# Patient Record
Sex: Male | Born: 1950 | Race: White | Hispanic: No | Marital: Single | Smoking: Current some day smoker
Health system: Southern US, Community
[De-identification: ages and names within clinical notes are randomized; demographics above are authoritative.]

## PROBLEM LIST (undated history)

## (undated) DIAGNOSIS — I10 Essential (primary) hypertension: Secondary | ICD-10-CM

## (undated) DIAGNOSIS — H44002 Unspecified purulent endophthalmitis, left eye: Secondary | ICD-10-CM

## (undated) DIAGNOSIS — K219 Gastro-esophageal reflux disease without esophagitis: Secondary | ICD-10-CM

## (undated) DIAGNOSIS — R768 Other specified abnormal immunological findings in serum: Secondary | ICD-10-CM

## (undated) DIAGNOSIS — F909 Attention-deficit hyperactivity disorder, unspecified type: Secondary | ICD-10-CM

## (undated) DIAGNOSIS — L80 Vitiligo: Secondary | ICD-10-CM

## (undated) DIAGNOSIS — R739 Hyperglycemia, unspecified: Secondary | ICD-10-CM

## (undated) DIAGNOSIS — M545 Low back pain, unspecified: Secondary | ICD-10-CM

## (undated) DIAGNOSIS — E1110 Type 2 diabetes mellitus with ketoacidosis without coma: Secondary | ICD-10-CM

## (undated) DIAGNOSIS — E119 Type 2 diabetes mellitus without complications: Secondary | ICD-10-CM

## (undated) DIAGNOSIS — G8929 Other chronic pain: Secondary | ICD-10-CM

## (undated) HISTORY — PX: INGUINAL HERNIA REPAIR: SUR1180

## (undated) HISTORY — PX: CHOLECYSTECTOMY: SHX55

---

## 1978-05-09 HISTORY — PX: KNEE CARTILAGE SURGERY: SHX688

## 2008-12-04 ENCOUNTER — Encounter: Admission: RE | Admit: 2008-12-04 | Discharge: 2008-12-04 | Payer: Self-pay | Admitting: Neurology

## 2009-03-09 HISTORY — PX: BRAIN SURGERY: SHX531

## 2012-10-30 ENCOUNTER — Telehealth: Payer: Self-pay | Admitting: Neurology

## 2012-10-31 NOTE — Telephone Encounter (Signed)
Patient will be assigned a doctor and called to be schedule.

## 2015-07-16 ENCOUNTER — Other Ambulatory Visit: Payer: Self-pay | Admitting: Family Medicine

## 2015-07-16 DIAGNOSIS — Z136 Encounter for screening for cardiovascular disorders: Secondary | ICD-10-CM

## 2015-07-27 ENCOUNTER — Ambulatory Visit: Payer: Self-pay

## 2015-07-28 ENCOUNTER — Ambulatory Visit
Admission: RE | Admit: 2015-07-28 | Discharge: 2015-07-28 | Disposition: A | Discharge status: Home / Self Care | Payer: Medicare Other | Source: Ambulatory Visit | Attending: Family Medicine | Admitting: Family Medicine

## 2015-07-28 DIAGNOSIS — Z136 Encounter for screening for cardiovascular disorders: Secondary | ICD-10-CM

## 2015-10-13 DIAGNOSIS — H2512 Age-related nuclear cataract, left eye: Secondary | ICD-10-CM | POA: Diagnosis not present

## 2015-10-13 DIAGNOSIS — H2511 Age-related nuclear cataract, right eye: Secondary | ICD-10-CM | POA: Diagnosis not present

## 2015-10-13 DIAGNOSIS — H02839 Dermatochalasis of unspecified eye, unspecified eyelid: Secondary | ICD-10-CM | POA: Diagnosis not present

## 2015-10-13 DIAGNOSIS — H18411 Arcus senilis, right eye: Secondary | ICD-10-CM | POA: Diagnosis not present

## 2015-10-26 DIAGNOSIS — H2512 Age-related nuclear cataract, left eye: Secondary | ICD-10-CM | POA: Diagnosis not present

## 2015-12-09 DIAGNOSIS — L720 Epidermal cyst: Secondary | ICD-10-CM | POA: Diagnosis not present

## 2015-12-09 DIAGNOSIS — L80 Vitiligo: Secondary | ICD-10-CM | POA: Diagnosis not present

## 2016-01-13 DIAGNOSIS — L578 Other skin changes due to chronic exposure to nonionizing radiation: Secondary | ICD-10-CM | POA: Diagnosis not present

## 2016-01-13 DIAGNOSIS — L0291 Cutaneous abscess, unspecified: Secondary | ICD-10-CM | POA: Diagnosis not present

## 2016-01-14 DIAGNOSIS — E119 Type 2 diabetes mellitus without complications: Secondary | ICD-10-CM | POA: Diagnosis not present

## 2016-01-14 DIAGNOSIS — F900 Attention-deficit hyperactivity disorder, predominantly inattentive type: Secondary | ICD-10-CM | POA: Diagnosis not present

## 2016-01-14 DIAGNOSIS — Z23 Encounter for immunization: Secondary | ICD-10-CM | POA: Diagnosis not present

## 2016-01-14 DIAGNOSIS — Z7984 Long term (current) use of oral hypoglycemic drugs: Secondary | ICD-10-CM | POA: Diagnosis not present

## 2016-01-14 DIAGNOSIS — I10 Essential (primary) hypertension: Secondary | ICD-10-CM | POA: Diagnosis not present

## 2016-08-03 DIAGNOSIS — Z79899 Other long term (current) drug therapy: Secondary | ICD-10-CM | POA: Diagnosis not present

## 2016-08-03 DIAGNOSIS — I10 Essential (primary) hypertension: Secondary | ICD-10-CM | POA: Diagnosis not present

## 2016-08-03 DIAGNOSIS — Z7984 Long term (current) use of oral hypoglycemic drugs: Secondary | ICD-10-CM | POA: Diagnosis not present

## 2016-08-03 DIAGNOSIS — Z0001 Encounter for general adult medical examination with abnormal findings: Secondary | ICD-10-CM | POA: Diagnosis not present

## 2016-08-03 DIAGNOSIS — M542 Cervicalgia: Secondary | ICD-10-CM | POA: Diagnosis not present

## 2016-08-03 DIAGNOSIS — F900 Attention-deficit hyperactivity disorder, predominantly inattentive type: Secondary | ICD-10-CM | POA: Diagnosis not present

## 2016-08-03 DIAGNOSIS — E7800 Pure hypercholesterolemia, unspecified: Secondary | ICD-10-CM | POA: Diagnosis not present

## 2016-08-03 DIAGNOSIS — Z125 Encounter for screening for malignant neoplasm of prostate: Secondary | ICD-10-CM | POA: Diagnosis not present

## 2016-08-03 DIAGNOSIS — E119 Type 2 diabetes mellitus without complications: Secondary | ICD-10-CM | POA: Diagnosis not present

## 2016-08-03 DIAGNOSIS — R7309 Other abnormal glucose: Secondary | ICD-10-CM | POA: Diagnosis not present

## 2016-08-04 DIAGNOSIS — M47812 Spondylosis without myelopathy or radiculopathy, cervical region: Secondary | ICD-10-CM | POA: Diagnosis not present

## 2016-08-04 DIAGNOSIS — M542 Cervicalgia: Secondary | ICD-10-CM | POA: Diagnosis not present

## 2016-08-04 DIAGNOSIS — M461 Sacroiliitis, not elsewhere classified: Secondary | ICD-10-CM | POA: Diagnosis not present

## 2016-08-04 DIAGNOSIS — M50221 Other cervical disc displacement at C4-C5 level: Secondary | ICD-10-CM | POA: Diagnosis not present

## 2016-08-04 DIAGNOSIS — M791 Myalgia: Secondary | ICD-10-CM | POA: Diagnosis not present

## 2016-08-09 DIAGNOSIS — M50221 Other cervical disc displacement at C4-C5 level: Secondary | ICD-10-CM | POA: Diagnosis not present

## 2016-08-09 DIAGNOSIS — M9901 Segmental and somatic dysfunction of cervical region: Secondary | ICD-10-CM | POA: Diagnosis not present

## 2016-08-09 DIAGNOSIS — M791 Myalgia: Secondary | ICD-10-CM | POA: Diagnosis not present

## 2016-08-09 DIAGNOSIS — M9903 Segmental and somatic dysfunction of lumbar region: Secondary | ICD-10-CM | POA: Diagnosis not present

## 2016-08-09 DIAGNOSIS — M47812 Spondylosis without myelopathy or radiculopathy, cervical region: Secondary | ICD-10-CM | POA: Diagnosis not present

## 2016-08-09 DIAGNOSIS — M50322 Other cervical disc degeneration at C5-C6 level: Secondary | ICD-10-CM | POA: Diagnosis not present

## 2016-08-09 DIAGNOSIS — M6240 Contracture of muscle, unspecified site: Secondary | ICD-10-CM | POA: Diagnosis not present

## 2016-08-09 DIAGNOSIS — M461 Sacroiliitis, not elsewhere classified: Secondary | ICD-10-CM | POA: Diagnosis not present

## 2016-08-09 DIAGNOSIS — M542 Cervicalgia: Secondary | ICD-10-CM | POA: Diagnosis not present

## 2016-08-11 DIAGNOSIS — M9903 Segmental and somatic dysfunction of lumbar region: Secondary | ICD-10-CM | POA: Diagnosis not present

## 2016-08-11 DIAGNOSIS — M461 Sacroiliitis, not elsewhere classified: Secondary | ICD-10-CM | POA: Diagnosis not present

## 2016-08-11 DIAGNOSIS — M6240 Contracture of muscle, unspecified site: Secondary | ICD-10-CM | POA: Diagnosis not present

## 2016-08-11 DIAGNOSIS — M791 Myalgia: Secondary | ICD-10-CM | POA: Diagnosis not present

## 2016-08-11 DIAGNOSIS — M9901 Segmental and somatic dysfunction of cervical region: Secondary | ICD-10-CM | POA: Diagnosis not present

## 2016-08-11 DIAGNOSIS — M50221 Other cervical disc displacement at C4-C5 level: Secondary | ICD-10-CM | POA: Diagnosis not present

## 2016-08-11 DIAGNOSIS — M47812 Spondylosis without myelopathy or radiculopathy, cervical region: Secondary | ICD-10-CM | POA: Diagnosis not present

## 2016-08-11 DIAGNOSIS — M542 Cervicalgia: Secondary | ICD-10-CM | POA: Diagnosis not present

## 2016-08-11 DIAGNOSIS — M50322 Other cervical disc degeneration at C5-C6 level: Secondary | ICD-10-CM | POA: Diagnosis not present

## 2016-08-15 DIAGNOSIS — M9901 Segmental and somatic dysfunction of cervical region: Secondary | ICD-10-CM | POA: Diagnosis not present

## 2016-08-15 DIAGNOSIS — M50322 Other cervical disc degeneration at C5-C6 level: Secondary | ICD-10-CM | POA: Diagnosis not present

## 2016-08-15 DIAGNOSIS — M461 Sacroiliitis, not elsewhere classified: Secondary | ICD-10-CM | POA: Diagnosis not present

## 2016-08-15 DIAGNOSIS — M6240 Contracture of muscle, unspecified site: Secondary | ICD-10-CM | POA: Diagnosis not present

## 2016-08-15 DIAGNOSIS — M791 Myalgia: Secondary | ICD-10-CM | POA: Diagnosis not present

## 2016-08-15 DIAGNOSIS — M47812 Spondylosis without myelopathy or radiculopathy, cervical region: Secondary | ICD-10-CM | POA: Diagnosis not present

## 2016-08-15 DIAGNOSIS — M9903 Segmental and somatic dysfunction of lumbar region: Secondary | ICD-10-CM | POA: Diagnosis not present

## 2016-08-15 DIAGNOSIS — M542 Cervicalgia: Secondary | ICD-10-CM | POA: Diagnosis not present

## 2016-08-15 DIAGNOSIS — M50221 Other cervical disc displacement at C4-C5 level: Secondary | ICD-10-CM | POA: Diagnosis not present

## 2016-08-17 DIAGNOSIS — M542 Cervicalgia: Secondary | ICD-10-CM | POA: Diagnosis not present

## 2016-08-17 DIAGNOSIS — M6240 Contracture of muscle, unspecified site: Secondary | ICD-10-CM | POA: Diagnosis not present

## 2016-08-17 DIAGNOSIS — M9903 Segmental and somatic dysfunction of lumbar region: Secondary | ICD-10-CM | POA: Diagnosis not present

## 2016-08-17 DIAGNOSIS — M50221 Other cervical disc displacement at C4-C5 level: Secondary | ICD-10-CM | POA: Diagnosis not present

## 2016-08-17 DIAGNOSIS — M791 Myalgia: Secondary | ICD-10-CM | POA: Diagnosis not present

## 2016-08-17 DIAGNOSIS — M9901 Segmental and somatic dysfunction of cervical region: Secondary | ICD-10-CM | POA: Diagnosis not present

## 2016-08-17 DIAGNOSIS — M47812 Spondylosis without myelopathy or radiculopathy, cervical region: Secondary | ICD-10-CM | POA: Diagnosis not present

## 2016-08-17 DIAGNOSIS — M50322 Other cervical disc degeneration at C5-C6 level: Secondary | ICD-10-CM | POA: Diagnosis not present

## 2016-08-17 DIAGNOSIS — M461 Sacroiliitis, not elsewhere classified: Secondary | ICD-10-CM | POA: Diagnosis not present

## 2016-08-19 DIAGNOSIS — M9903 Segmental and somatic dysfunction of lumbar region: Secondary | ICD-10-CM | POA: Diagnosis not present

## 2016-08-19 DIAGNOSIS — M6240 Contracture of muscle, unspecified site: Secondary | ICD-10-CM | POA: Diagnosis not present

## 2016-08-19 DIAGNOSIS — M50322 Other cervical disc degeneration at C5-C6 level: Secondary | ICD-10-CM | POA: Diagnosis not present

## 2016-08-19 DIAGNOSIS — M47812 Spondylosis without myelopathy or radiculopathy, cervical region: Secondary | ICD-10-CM | POA: Diagnosis not present

## 2016-08-19 DIAGNOSIS — M461 Sacroiliitis, not elsewhere classified: Secondary | ICD-10-CM | POA: Diagnosis not present

## 2016-08-19 DIAGNOSIS — M9901 Segmental and somatic dysfunction of cervical region: Secondary | ICD-10-CM | POA: Diagnosis not present

## 2016-08-19 DIAGNOSIS — M545 Low back pain: Secondary | ICD-10-CM | POA: Diagnosis not present

## 2016-08-19 DIAGNOSIS — M50221 Other cervical disc displacement at C4-C5 level: Secondary | ICD-10-CM | POA: Diagnosis not present

## 2016-08-19 DIAGNOSIS — M791 Myalgia: Secondary | ICD-10-CM | POA: Diagnosis not present

## 2016-08-22 DIAGNOSIS — M47812 Spondylosis without myelopathy or radiculopathy, cervical region: Secondary | ICD-10-CM | POA: Diagnosis not present

## 2016-08-22 DIAGNOSIS — M50221 Other cervical disc displacement at C4-C5 level: Secondary | ICD-10-CM | POA: Diagnosis not present

## 2016-08-22 DIAGNOSIS — M50322 Other cervical disc degeneration at C5-C6 level: Secondary | ICD-10-CM | POA: Diagnosis not present

## 2016-08-22 DIAGNOSIS — M6240 Contracture of muscle, unspecified site: Secondary | ICD-10-CM | POA: Diagnosis not present

## 2016-08-22 DIAGNOSIS — M9903 Segmental and somatic dysfunction of lumbar region: Secondary | ICD-10-CM | POA: Diagnosis not present

## 2016-08-22 DIAGNOSIS — M461 Sacroiliitis, not elsewhere classified: Secondary | ICD-10-CM | POA: Diagnosis not present

## 2016-08-22 DIAGNOSIS — M9901 Segmental and somatic dysfunction of cervical region: Secondary | ICD-10-CM | POA: Diagnosis not present

## 2016-08-22 DIAGNOSIS — M791 Myalgia: Secondary | ICD-10-CM | POA: Diagnosis not present

## 2016-08-22 DIAGNOSIS — M545 Low back pain: Secondary | ICD-10-CM | POA: Diagnosis not present

## 2016-08-24 DIAGNOSIS — M791 Myalgia: Secondary | ICD-10-CM | POA: Diagnosis not present

## 2016-08-24 DIAGNOSIS — M9903 Segmental and somatic dysfunction of lumbar region: Secondary | ICD-10-CM | POA: Diagnosis not present

## 2016-08-24 DIAGNOSIS — M47812 Spondylosis without myelopathy or radiculopathy, cervical region: Secondary | ICD-10-CM | POA: Diagnosis not present

## 2016-08-24 DIAGNOSIS — M50221 Other cervical disc displacement at C4-C5 level: Secondary | ICD-10-CM | POA: Diagnosis not present

## 2016-08-24 DIAGNOSIS — M6240 Contracture of muscle, unspecified site: Secondary | ICD-10-CM | POA: Diagnosis not present

## 2016-08-24 DIAGNOSIS — M461 Sacroiliitis, not elsewhere classified: Secondary | ICD-10-CM | POA: Diagnosis not present

## 2016-08-24 DIAGNOSIS — M9901 Segmental and somatic dysfunction of cervical region: Secondary | ICD-10-CM | POA: Diagnosis not present

## 2016-08-24 DIAGNOSIS — M545 Low back pain: Secondary | ICD-10-CM | POA: Diagnosis not present

## 2016-08-24 DIAGNOSIS — M50322 Other cervical disc degeneration at C5-C6 level: Secondary | ICD-10-CM | POA: Diagnosis not present

## 2016-08-26 DIAGNOSIS — M9903 Segmental and somatic dysfunction of lumbar region: Secondary | ICD-10-CM | POA: Diagnosis not present

## 2016-08-26 DIAGNOSIS — M545 Low back pain: Secondary | ICD-10-CM | POA: Diagnosis not present

## 2016-08-26 DIAGNOSIS — M791 Myalgia: Secondary | ICD-10-CM | POA: Diagnosis not present

## 2016-08-26 DIAGNOSIS — M47812 Spondylosis without myelopathy or radiculopathy, cervical region: Secondary | ICD-10-CM | POA: Diagnosis not present

## 2016-08-26 DIAGNOSIS — M50221 Other cervical disc displacement at C4-C5 level: Secondary | ICD-10-CM | POA: Diagnosis not present

## 2016-08-26 DIAGNOSIS — M9901 Segmental and somatic dysfunction of cervical region: Secondary | ICD-10-CM | POA: Diagnosis not present

## 2016-08-26 DIAGNOSIS — M461 Sacroiliitis, not elsewhere classified: Secondary | ICD-10-CM | POA: Diagnosis not present

## 2016-08-26 DIAGNOSIS — M6240 Contracture of muscle, unspecified site: Secondary | ICD-10-CM | POA: Diagnosis not present

## 2016-08-26 DIAGNOSIS — M50322 Other cervical disc degeneration at C5-C6 level: Secondary | ICD-10-CM | POA: Diagnosis not present

## 2016-08-29 DIAGNOSIS — M9901 Segmental and somatic dysfunction of cervical region: Secondary | ICD-10-CM | POA: Diagnosis not present

## 2016-08-29 DIAGNOSIS — M50221 Other cervical disc displacement at C4-C5 level: Secondary | ICD-10-CM | POA: Diagnosis not present

## 2016-08-29 DIAGNOSIS — M6240 Contracture of muscle, unspecified site: Secondary | ICD-10-CM | POA: Diagnosis not present

## 2016-08-29 DIAGNOSIS — M50322 Other cervical disc degeneration at C5-C6 level: Secondary | ICD-10-CM | POA: Diagnosis not present

## 2016-08-29 DIAGNOSIS — M461 Sacroiliitis, not elsewhere classified: Secondary | ICD-10-CM | POA: Diagnosis not present

## 2016-08-29 DIAGNOSIS — M791 Myalgia: Secondary | ICD-10-CM | POA: Diagnosis not present

## 2016-08-29 DIAGNOSIS — M9903 Segmental and somatic dysfunction of lumbar region: Secondary | ICD-10-CM | POA: Diagnosis not present

## 2016-08-29 DIAGNOSIS — M47812 Spondylosis without myelopathy or radiculopathy, cervical region: Secondary | ICD-10-CM | POA: Diagnosis not present

## 2016-08-31 DIAGNOSIS — M461 Sacroiliitis, not elsewhere classified: Secondary | ICD-10-CM | POA: Diagnosis not present

## 2016-08-31 DIAGNOSIS — M47812 Spondylosis without myelopathy or radiculopathy, cervical region: Secondary | ICD-10-CM | POA: Diagnosis not present

## 2016-08-31 DIAGNOSIS — M9901 Segmental and somatic dysfunction of cervical region: Secondary | ICD-10-CM | POA: Diagnosis not present

## 2016-08-31 DIAGNOSIS — M6240 Contracture of muscle, unspecified site: Secondary | ICD-10-CM | POA: Diagnosis not present

## 2016-08-31 DIAGNOSIS — M9903 Segmental and somatic dysfunction of lumbar region: Secondary | ICD-10-CM | POA: Diagnosis not present

## 2016-08-31 DIAGNOSIS — M50322 Other cervical disc degeneration at C5-C6 level: Secondary | ICD-10-CM | POA: Diagnosis not present

## 2016-08-31 DIAGNOSIS — M50221 Other cervical disc displacement at C4-C5 level: Secondary | ICD-10-CM | POA: Diagnosis not present

## 2016-08-31 DIAGNOSIS — M791 Myalgia: Secondary | ICD-10-CM | POA: Diagnosis not present

## 2016-09-02 DIAGNOSIS — M50221 Other cervical disc displacement at C4-C5 level: Secondary | ICD-10-CM | POA: Diagnosis not present

## 2016-09-02 DIAGNOSIS — M791 Myalgia: Secondary | ICD-10-CM | POA: Diagnosis not present

## 2016-09-02 DIAGNOSIS — M461 Sacroiliitis, not elsewhere classified: Secondary | ICD-10-CM | POA: Diagnosis not present

## 2016-09-02 DIAGNOSIS — M9901 Segmental and somatic dysfunction of cervical region: Secondary | ICD-10-CM | POA: Diagnosis not present

## 2016-09-02 DIAGNOSIS — M47812 Spondylosis without myelopathy or radiculopathy, cervical region: Secondary | ICD-10-CM | POA: Diagnosis not present

## 2016-09-02 DIAGNOSIS — M9903 Segmental and somatic dysfunction of lumbar region: Secondary | ICD-10-CM | POA: Diagnosis not present

## 2016-09-02 DIAGNOSIS — M50322 Other cervical disc degeneration at C5-C6 level: Secondary | ICD-10-CM | POA: Diagnosis not present

## 2016-09-02 DIAGNOSIS — M6240 Contracture of muscle, unspecified site: Secondary | ICD-10-CM | POA: Diagnosis not present

## 2016-09-05 DIAGNOSIS — M50221 Other cervical disc displacement at C4-C5 level: Secondary | ICD-10-CM | POA: Diagnosis not present

## 2016-09-05 DIAGNOSIS — M791 Myalgia: Secondary | ICD-10-CM | POA: Diagnosis not present

## 2016-09-05 DIAGNOSIS — M6240 Contracture of muscle, unspecified site: Secondary | ICD-10-CM | POA: Diagnosis not present

## 2016-09-05 DIAGNOSIS — M461 Sacroiliitis, not elsewhere classified: Secondary | ICD-10-CM | POA: Diagnosis not present

## 2016-09-05 DIAGNOSIS — M50322 Other cervical disc degeneration at C5-C6 level: Secondary | ICD-10-CM | POA: Diagnosis not present

## 2016-09-05 DIAGNOSIS — M9901 Segmental and somatic dysfunction of cervical region: Secondary | ICD-10-CM | POA: Diagnosis not present

## 2016-09-05 DIAGNOSIS — M47812 Spondylosis without myelopathy or radiculopathy, cervical region: Secondary | ICD-10-CM | POA: Diagnosis not present

## 2016-09-05 DIAGNOSIS — M9903 Segmental and somatic dysfunction of lumbar region: Secondary | ICD-10-CM | POA: Diagnosis not present

## 2017-01-17 DIAGNOSIS — Z23 Encounter for immunization: Secondary | ICD-10-CM | POA: Diagnosis not present

## 2017-02-03 DIAGNOSIS — F900 Attention-deficit hyperactivity disorder, predominantly inattentive type: Secondary | ICD-10-CM | POA: Diagnosis not present

## 2017-02-03 DIAGNOSIS — I10 Essential (primary) hypertension: Secondary | ICD-10-CM | POA: Diagnosis not present

## 2017-02-03 DIAGNOSIS — E119 Type 2 diabetes mellitus without complications: Secondary | ICD-10-CM | POA: Diagnosis not present

## 2017-02-03 DIAGNOSIS — Z79899 Other long term (current) drug therapy: Secondary | ICD-10-CM | POA: Diagnosis not present

## 2017-02-03 DIAGNOSIS — E7800 Pure hypercholesterolemia, unspecified: Secondary | ICD-10-CM | POA: Diagnosis not present

## 2017-02-03 DIAGNOSIS — Z1159 Encounter for screening for other viral diseases: Secondary | ICD-10-CM | POA: Diagnosis not present

## 2017-08-21 ENCOUNTER — Inpatient Hospital Stay (HOSPITAL_COMMUNITY)
Admission: EM | Admit: 2017-08-21 | Discharge: 2017-08-23 | DRG: 638 | Disposition: A | Discharge status: Home / Self Care | Payer: Medicare Other | Attending: Internal Medicine | Admitting: Internal Medicine

## 2017-08-21 ENCOUNTER — Encounter (HOSPITAL_COMMUNITY): Payer: Self-pay

## 2017-08-21 ENCOUNTER — Other Ambulatory Visit: Payer: Self-pay

## 2017-08-21 DIAGNOSIS — E0810 Diabetes mellitus due to underlying condition with ketoacidosis without coma: Secondary | ICD-10-CM | POA: Diagnosis not present

## 2017-08-21 DIAGNOSIS — Z881 Allergy status to other antibiotic agents status: Secondary | ICD-10-CM | POA: Diagnosis not present

## 2017-08-21 DIAGNOSIS — E1165 Type 2 diabetes mellitus with hyperglycemia: Secondary | ICD-10-CM | POA: Diagnosis present

## 2017-08-21 DIAGNOSIS — N179 Acute kidney failure, unspecified: Secondary | ICD-10-CM | POA: Diagnosis present

## 2017-08-21 DIAGNOSIS — Z87891 Personal history of nicotine dependence: Secondary | ICD-10-CM | POA: Diagnosis not present

## 2017-08-21 DIAGNOSIS — Z794 Long term (current) use of insulin: Secondary | ICD-10-CM | POA: Diagnosis not present

## 2017-08-21 DIAGNOSIS — E1369 Other specified diabetes mellitus with other specified complication: Secondary | ICD-10-CM | POA: Diagnosis not present

## 2017-08-21 DIAGNOSIS — G500 Trigeminal neuralgia: Secondary | ICD-10-CM | POA: Diagnosis present

## 2017-08-21 DIAGNOSIS — Z7984 Long term (current) use of oral hypoglycemic drugs: Secondary | ICD-10-CM

## 2017-08-21 DIAGNOSIS — E871 Hypo-osmolality and hyponatremia: Secondary | ICD-10-CM | POA: Diagnosis present

## 2017-08-21 DIAGNOSIS — I10 Essential (primary) hypertension: Secondary | ICD-10-CM | POA: Diagnosis not present

## 2017-08-21 DIAGNOSIS — E860 Dehydration: Secondary | ICD-10-CM | POA: Diagnosis present

## 2017-08-21 DIAGNOSIS — Z885 Allergy status to narcotic agent status: Secondary | ICD-10-CM | POA: Diagnosis not present

## 2017-08-21 DIAGNOSIS — E1110 Type 2 diabetes mellitus with ketoacidosis without coma: Principal | ICD-10-CM | POA: Diagnosis present

## 2017-08-21 DIAGNOSIS — E875 Hyperkalemia: Secondary | ICD-10-CM | POA: Diagnosis present

## 2017-08-21 DIAGNOSIS — E1121 Type 2 diabetes mellitus with diabetic nephropathy: Secondary | ICD-10-CM | POA: Diagnosis not present

## 2017-08-21 DIAGNOSIS — F909 Attention-deficit hyperactivity disorder, unspecified type: Secondary | ICD-10-CM | POA: Diagnosis not present

## 2017-08-21 HISTORY — DX: Type 2 diabetes mellitus with ketoacidosis without coma: E11.10

## 2017-08-21 HISTORY — DX: Other specified abnormal immunological findings in serum: R76.8

## 2017-08-21 HISTORY — DX: Hyperglycemia, unspecified: R73.9

## 2017-08-21 HISTORY — DX: Unspecified purulent endophthalmitis, left eye: H44.002

## 2017-08-21 HISTORY — DX: Type 2 diabetes mellitus without complications: E11.9

## 2017-08-21 HISTORY — DX: Gastro-esophageal reflux disease without esophagitis: K21.9

## 2017-08-21 HISTORY — DX: Low back pain: M54.5

## 2017-08-21 HISTORY — DX: Essential (primary) hypertension: I10

## 2017-08-21 HISTORY — DX: Attention-deficit hyperactivity disorder, unspecified type: F90.9

## 2017-08-21 HISTORY — DX: Low back pain, unspecified: M54.50

## 2017-08-21 HISTORY — DX: Other chronic pain: G89.29

## 2017-08-21 HISTORY — DX: Vitiligo: L80

## 2017-08-21 LAB — BASIC METABOLIC PANEL
ANION GAP: 21 — AB (ref 5–15)
Anion gap: 12 (ref 5–15)
BUN: 31 mg/dL — ABNORMAL HIGH (ref 6–20)
BUN: 26 mg/dL — AB (ref 6–20)
CHLORIDE: 103 mmol/L (ref 101–111)
CO2: 17 mmol/L — ABNORMAL LOW (ref 22–32)
CO2: 19 mmol/L — ABNORMAL LOW (ref 22–32)
Calcium: 9.3 mg/dL (ref 8.9–10.3)
Calcium: 8.4 mg/dL — ABNORMAL LOW (ref 8.9–10.3)
Chloride: 92 mmol/L — ABNORMAL LOW (ref 101–111)
Creatinine, Ser: 1.91 mg/dL — ABNORMAL HIGH (ref 0.61–1.24)
Creatinine, Ser: 1.36 mg/dL — ABNORMAL HIGH (ref 0.61–1.24)
GFR calc Af Amer: 40 mL/min — ABNORMAL LOW (ref >60)
GFR calc Af Amer: >60 mL/min (ref >60)
GFR calc non Af Amer: 52 mL/min — ABNORMAL LOW (ref >60)
GFR, EST NON AFRICAN AMERICAN: 35 mL/min — AB (ref >60)
GLUCOSE: 512 mg/dL — AB (ref 65–99)
GLUCOSE: 260 mg/dL — AB (ref 65–99)
POTASSIUM: 5.9 mmol/L — AB (ref 3.5–5.1)
POTASSIUM: 4.7 mmol/L (ref 3.5–5.1)
SODIUM: 134 mmol/L — AB (ref 135–145)
Sodium: 130 mmol/L — ABNORMAL LOW (ref 135–145)

## 2017-08-21 LAB — CBG MONITORING, ED
Glucose-Capillary: 507 mg/dL (ref 65–99)
Glucose-Capillary: 474 mg/dL — ABNORMAL HIGH (ref 65–99)
Glucose-Capillary: 326 mg/dL — ABNORMAL HIGH (ref 65–99)
Glucose-Capillary: 280 mg/dL — ABNORMAL HIGH (ref 65–99)
Glucose-Capillary: 234 mg/dL — ABNORMAL HIGH (ref 65–99)
Glucose-Capillary: 245 mg/dL — ABNORMAL HIGH (ref 65–99)
Glucose-Capillary: 219 mg/dL — ABNORMAL HIGH (ref 65–99)

## 2017-08-21 LAB — URINALYSIS, ROUTINE W REFLEX MICROSCOPIC
BACTERIA UA: NONE SEEN
Bilirubin Urine: NEGATIVE
Glucose, UA: >=500 mg/dL — AB
HGB URINE DIPSTICK: NEGATIVE
KETONES UR: 80 mg/dL — AB
LEUKOCYTES UA: NEGATIVE
Nitrite: NEGATIVE
PROTEIN: NEGATIVE mg/dL
RBC / HPF: NONE SEEN RBC/hpf (ref 0–5)
Specific Gravity, Urine: 1.024 (ref 1.005–1.030)
pH: 5 (ref 5.0–8.0)

## 2017-08-21 LAB — CBC
HEMATOCRIT: 49.8 % (ref 39.0–52.0)
HEMOGLOBIN: 17 g/dL (ref 13.0–17.0)
MCH: 34.5 pg — ABNORMAL HIGH (ref 26.0–34.0)
MCHC: 34.1 g/dL (ref 30.0–36.0)
MCV: 101 fL — ABNORMAL HIGH (ref 78.0–100.0)
Platelets: 258 10*3/uL (ref 150–400)
RBC: 4.93 MIL/uL (ref 4.22–5.81)
RDW: 12.2 % (ref 11.5–15.5)
WBC: 9.8 10*3/uL (ref 4.0–10.5)

## 2017-08-21 MED ORDER — DEXTROSE-NACL 5-0.45 % IV SOLN
INTRAVENOUS | Status: DC
Start: 2017-08-21 — End: 2017-08-21

## 2017-08-21 MED ORDER — HEPARIN SODIUM (PORCINE) 5000 UNIT/ML IJ SOLN
5000.0000 [IU] | Freq: Three times a day (TID) | INTRAMUSCULAR | Status: DC
  Administered 2017-08-21 – 2017-08-22 (×2): 5000 [IU] via SUBCUTANEOUS
  Filled 2017-08-21 (×2): qty 1

## 2017-08-21 MED ORDER — SODIUM CHLORIDE 0.9 % IV SOLN
INTRAVENOUS | Status: DC
  Administered 2017-08-21: 4.1 [IU]/h via INTRAVENOUS
  Filled 2017-08-21: qty 1

## 2017-08-21 MED ORDER — DIPHENHYDRAMINE HCL 50 MG/ML IJ SOLN: 12.5000 mg | Freq: Every evening | INTRAMUSCULAR | Status: DC | PRN

## 2017-08-21 MED ORDER — SODIUM CHLORIDE 0.9 % IV SOLN: INTRAVENOUS | Status: DC

## 2017-08-21 MED ORDER — ONDANSETRON HCL 4 MG/2ML IJ SOLN: 4.0000 mg | Freq: Four times a day (QID) | INTRAMUSCULAR | Status: DC | PRN

## 2017-08-21 MED ORDER — SODIUM CHLORIDE 0.9 % IV BOLUS: 1000.0000 mL | Freq: Once | INTRAVENOUS | Status: DC

## 2017-08-21 MED ORDER — SODIUM CHLORIDE 0.9 % IV SOLN
INTRAVENOUS | Status: DC
  Administered 2017-08-21: 17:00:00 via INTRAVENOUS

## 2017-08-21 MED ORDER — DEXTROSE-NACL 5-0.45 % IV SOLN
INTRAVENOUS | Status: DC
  Administered 2017-08-21 – 2017-08-22 (×2): via INTRAVENOUS

## 2017-08-21 MED ORDER — INSULIN ASPART 100 UNIT/ML ~~LOC~~ SOLN: 8.0000 [IU] | Freq: Once | SUBCUTANEOUS | Status: DC

## 2017-08-21 NOTE — H&P (Addendum)
History and Physical    Luis Long KNL:[id number] DOB: 08/18/1950 DOA: 08/21/2017  PCP: No primary care provider on file.[facility] Patient coming from: home  Chief Complaint: hyperglycemia  HPI: Luis Long is a pleasant 67 y.o. male with medical history significant hypertension and hyperglycemia Presents to the emergency department with the chief complaint hyperglycemia. Initial evaluation reveals AKA within an ion gap of 21. [facility] are asked to admit  Information is obtained from the patient. He states he was in his usual state of health until roughly 7 days ago he began to feeling "poorly". He describes decreased stamina increased fatigue difficulty sleeping polydipsia polyuria abdominal cramping. He denies nausea vomiting diarrhea dysuria hematuria frequency or urgency. Does have family history diabetes and was concerned so he went to urgent care for evaluation. At that time CBG was greater than 400 he was instructed to come to the emergency department. He denies headache dizziness syncope or near-syncope. He denies chest pain palpitation shortness of breath. He reports he has been on metformin for a couple of years but does not monitor his blood sugar.He "watches his carbs"    ED Course: in the emergency department he's afebrile hemodynamically stable and not hypoxic  Review of Systems: As per HPI otherwise all other systems reviewed and are negative.   Ambulatory Status:ambulates independently is independent with ADLs  Past Medical History:  Diagnosis Date  . ADHD   . DKA (diabetic ketoacidoses) ([place])   . Hyperglycemia    takes metformin  . Hypertension     Past Surgical History:  Procedure Laterality Date  . BRAIN SURGERY     trigeminal neuralgia    Social History   Socioeconomic History  . Marital status: Single    Spouse name: Not on file  . Number of children: Not on file  . Years of education: Not on file  . Highest education level: Not  on file  Occupational History  . Not on file  Social Needs  . Financial resource strain: Not on file  . Food insecurity:    Worry: Not on file    Inability: Not on file  . Transportation needs:    Medical: Not on file    Non-medical: Not on file  Tobacco Use  . Smoking status: Former Research scientist (life sciences)  . Smokeless tobacco: Never Used  Substance and Sexual Activity  . Alcohol use: Yes    Comment: occ  . Drug use: Not on file  . Sexual activity: Not on file  Lifestyle  . Physical activity:    Days per week: Not on file    Minutes per session: Not on file  . Stress: Not on file  Relationships  . Social connections:    Talks on phone: Not on file    Gets together: Not on file    Attends religious service: Not on file    Active member of club or organization: Not on file    Attends meetings of clubs or organizations: Not on file    Relationship status: Not on file  . Intimate partner violence:    Fear of current or ex partner: Not on file    Emotionally abused: Not on file    Physically abused: Not on file    Forced sexual activity: Not on file  Other Topics Concern  . Not on file  Social History Narrative  . Not on file    Allergies  Allergen Reactions  . Erythromycin Nausea Only  . Vicodin [Hydrocodone-Acetaminophen] Itching  History reviewed. No pertinent family history.  Prior to Admission medications   Not on File    Physical Exam: Vitals:   08/21/17 1306 08/21/17 1501 08/21/17 1545  BP: 122/87 110/61 110/72  Pulse: 93 86 86  Resp: 16 16 19   Temp: 98.1 F (36.7 C)    TempSrc: Oral    SpO2: 100% 98% 98%     General:  Appears calm and comfortable in no acute distress Eyes:  PERRL, EOMI, normal lids, iris ENT:  grossly normal hearing, lips & tongue, mucous membranes of his mouth are pink and quite dry Neck:  no LAD, masses or thyromegaly Cardiovascular:  RRR, no m/r/g. No LE edema.  Respiratory:  CTA bilaterally, no w/r/r. Normal respiratory  effort. Abdomen:  soft, ntnd, very sluggish bowel sounds no guarding or rebounding Skin:  no rash or induration seen on limited exam Musculoskeletal:  grossly normal tone BUE/BLE, good ROM, no bony abnormality Psychiatric:  grossly normal mood and affect, speech fluent and appropriate, AOx3 Neurologic:  CN 2-12 grossly intact, moves all extremities in coordinated fashion, sensation intact speech clear facial symmetry  Labs on Admission: I have personally reviewed following labs and imaging studies  CBC: Recent Labs  Lab 08/21/17 1323  WBC 9.8  HGB 17.0  HCT 49.8  MCV 101.0*  PLT 213   Basic Metabolic Panel: Recent Labs  Lab 08/21/17 1323  NA 130*  K 5.9*  CL 92*  CO2 17*  GLUCOSE 512*  BUN 31*  CREATININE 1.91*  CALCIUM 9.3   GFR: CrCl cannot be calculated (Unknown ideal weight.). Liver Function Tests: No results for input(s): AST, ALT, ALKPHOS, BILITOT, PROT, ALBUMIN in the last 168 hours. No results for input(s): LIPASE, AMYLASE in the last 168 hours. No results for input(s): AMMONIA in the last 168 hours. Coagulation Profile: No results for input(s): INR, PROTIME in the last 168 hours. Cardiac Enzymes: No results for input(s): CKTOTAL, CKMB, CKMBINDEX, TROPONINI in the last 168 hours. BNP (last 3 results) No results for input(s): PROBNP in the last 8760 hours. HbA1C: No results for input(s): HGBA1C in the last 72 hours. CBG: Recent Labs  Lab 08/21/17 1322 08/21/17 1617  GLUCAP 507* 474*   Lipid Profile: No results for input(s): CHOL, HDL, LDLCALC, TRIG, CHOLHDL, LDLDIRECT in the last 72 hours. Thyroid Function Tests: No results for input(s): TSH, T4TOTAL, FREET4, T3FREE, THYROIDAB in the last 72 hours. Anemia Panel: No results for input(s): VITAMINB12, FOLATE, FERRITIN, TIBC, IRON, RETICCTPCT in the last 72 hours. Urine analysis:    Component Value Date/Time   COLORURINE YELLOW 08/21/2017 1316   APPEARANCEUR CLEAR 08/21/2017 1316   LABSPEC 1.024  08/21/2017 1316   PHURINE 5.0 08/21/2017 1316   GLUCOSEU >=500 (A) 08/21/2017 1316   HGBUR NEGATIVE 08/21/2017 1316   BILIRUBINUR NEGATIVE 08/21/2017 1316   KETONESUR 80 (A) 08/21/2017 1316   PROTEINUR NEGATIVE 08/21/2017 1316   NITRITE NEGATIVE 08/21/2017 1316   LEUKOCYTESUR NEGATIVE 08/21/2017 1316    Creatinine Clearance: CrCl cannot be calculated (Unknown ideal weight.).  Sepsis Labs: @LABRCNTIP (procalcitonin:4,lacticidven:4) )No results found for this or any previous visit (from the past 240 hour(s)).   Radiological Exams on Admission: No results found.  EKG: Sinus rhythm LVH with secondary repolarization abnormality Anterior Q waves, possibly due to LVH  Assessment/Plan Principal Problem:   DKA (diabetic ketoacidoses) ([place]) Active Problems:   HTN (hypertension)   Hyponatremia   Hyperkalemia   Acute kidney injury ([place])   ADHD   #1. DKA. Likely related to  progression of "borderline" diabetes. Reports compliance with diet and metformin.serum glucose 512 on admission. CO2 17 anion gap 21. Is provided with fluid resuscitation and insulin drip was initiated -Admit to step down -Continue insulin drip -BMET q4hours per protocol -obtain A1c -once gap is closed transition to SSI -continue IV fluids -change IV fluid to D5W per protocol -clear liquid diet -hold metformin -diabetes coordinator consult  #2. Acute kidney injury. Likely related to dehydration secondary to #1. Creatinine 1.91 on admission. -hold nephrotoxins -IV fluids as noted above -Monitor intake and output -Recheck in the morning  #3. Hypertension. Controlled in the emergency department. Home medications include ACE inhibitor. -hold lisinopril for now -monitor -resume ace inhibitor when indicated  4.hyponatremia/hyperkalemia. Related to #1 in the setting of dehydration. Potassium 5.9, sodium 130. ekg without acute changes.  -See #1 -IV fluids -bmet every 4 hours per protocol  5.ADHD. Home meds  include Adderall. Appears stable -continue home meds    DVT prophylaxis: heparin  Code Status: full  Family Communication: none present  Disposition Plan: home   Consults called: diabetes coordinator  Admission status: inpatient   Radene Gunning MD [facility]  If 7PM-7AM, please contact night-coverage [email or web address] Password [facility]  08/21/2017, 5:19 PM

## 2017-08-21 NOTE — ED Notes (Signed)
Attempted iv x 2

## 2017-08-21 NOTE — ED Triage Notes (Signed)
[facility] and was told he had high blood sugar. CBG greater than 400. Pt reports he has been feeling off and has had severe dry mouth.

## 2017-08-21 NOTE — ED Provider Notes (Signed)
[facility] EMERGENCY DEPARTMENT Provider Note   CSN: [id number] Arrival date & time: 08/21/17  1218     History   Chief Complaint Chief Complaint  Patient presents with  . Hyperglycemia    HPI Luis Long is a 67 y.o. male with PMH/o HTN who presents for evaluation of hyperglycemia.  Patient reports that over the last week, he has had increasing fatigue, abdominal cramping, bilateral leg cramps, polydipsia, polyuria.  Patient reports he became concerned about his symptoms due to a very strong family history of diabetes.  [facility].  At that time, his CBG was greater than 400 and he was prompted to go to the emergency department for further evaluation.  Patient states that over the last week, he just felt rundown and off.  He states he has had some generalized abdominal pain with no focal point of tenderness.  He states that he has been very dry mouth and very thirsty.  No nausea, vomiting.  Patient denies any fevers, chest pain, difficulty breathing.  The history is provided by the patient.    Past Medical History:  Diagnosis Date  . ADHD   . DKA (diabetic ketoacidoses) ([place])   . Hyperglycemia    takes metformin  . Hypertension     Patient Active Problem List   Diagnosis Date Noted  . DKA (diabetic ketoacidoses) ([place]) 08/21/2017  . HTN (hypertension) 08/21/2017  . Hyponatremia 08/21/2017  . Hyperkalemia 08/21/2017  . Acute kidney injury ([place]) 08/21/2017  . ADHD     Past Surgical History:  Procedure Laterality Date  . BRAIN SURGERY     trigeminal neuralgia        Home Medications    Prior to Admission medications   Medication Sig Start Date End Date Taking? Authorizing Provider  amphetamine-dextroamphetamine (ADDERALL) 10 MG tablet Take 10 mg by mouth daily. 08/04/17  Yes [provider]  Bioflavonoid Products (BIOFLEX PO) Take 1 tablet by mouth daily.   Yes [provider]  Biotin  10 MG TABS Take 1 mg by mouth daily.   Yes [provider]  lisinopril (PRINIVIL,ZESTRIL) 10 MG tablet Take 10 mg by mouth daily. 07/17/17  Yes [provider]  metFORMIN (GLUCOPHAGE) 500 MG tablet Take 500 mg by mouth 2 (two) times daily. 07/29/17  Yes [provider]  Multiple Vitamins-Minerals (MULTI FOR HIM 50+) TABS Take 1 tablet by mouth daily.   Yes [provider]  OVER THE COUNTER MEDICATION Take 1 tablet by mouth daily. [place] goat weed   Yes [provider]  saw palmetto 80 MG capsule Take 160 mg by mouth daily.   Yes [provider]  vitamin E 400 UNIT capsule Take 400 Units by mouth daily.   Yes [provider]    Family History History reviewed. No pertinent family history.  Social History Social History   Tobacco Use  . Smoking status: Former Research scientist (life sciences)  . Smokeless tobacco: Never Used  Substance Use Topics  . Alcohol use: Yes    Comment: occ  . Drug use: Not on file     Allergies   Erythromycin and Vicodin [hydrocodone-acetaminophen]   Review of Systems Review of Systems  Constitutional: Positive for fatigue. Negative for fever.  HENT: Negative for congestion.   Eyes: Negative for visual disturbance.  Respiratory: Negative for cough and shortness of breath.   Cardiovascular: Negative for chest pain.  Gastrointestinal: Negative for abdominal pain, diarrhea, nausea and  vomiting.  Endocrine: Positive for polydipsia, polyphagia and polyuria.  Genitourinary: Negative for dysuria and hematuria.  Musculoskeletal: Positive for myalgias. Negative for back pain and neck pain.  Skin: Negative for rash.  Neurological: Negative for dizziness, numbness and headaches.  All other systems reviewed and are negative.    Physical Exam Updated Vital Signs BP (!) 101/56   Pulse 87   Temp 98.1 F (36.7 C) (Oral)   Resp 18   SpO2 97%   Physical Exam  Constitutional: He is oriented to person, place, and time. He  appears well-developed and well-nourished.  HENT:  Head: Normocephalic and atraumatic.  Mouth/Throat: Oropharynx is clear and moist and mucous membranes are normal.  Eyes: Pupils are equal, round, and reactive to light. Conjunctivae, EOM and lids are normal.  Neck: Full passive range of motion without pain.  Cardiovascular: Normal rate, regular rhythm, normal heart sounds and normal pulses. Exam reveals no gallop and no friction rub.  No murmur heard. Pulmonary/Chest: Effort normal and breath sounds normal.  Abdominal: Soft. Normal appearance. There is generalized tenderness. There is no rigidity and no guarding.  Abdomen is soft, nondistended.  Generalized tenderness with no focal point.  Musculoskeletal: Normal range of motion.  Neurological: He is alert and oriented to person, place, and time.  Skin: Skin is warm and dry. Capillary refill takes less than 2 seconds.  Psychiatric: He has a normal mood and affect. His speech is normal.  Nursing note and vitals reviewed.    ED Treatments / Results  Labs (all labs ordered are listed, but only abnormal results are displayed) Labs Reviewed  BASIC METABOLIC PANEL - Abnormal; Notable for the following components:      Result Value   Sodium 130 (*)    Potassium 5.9 (*)    Chloride 92 (*)    CO2 17 (*)    Glucose, Bld 512 (*)    BUN 31 (*)    Creatinine, Ser 1.91 (*)    GFR calc non Af Amer 35 (*)    GFR calc Af Amer 40 (*)    Anion gap 21 (*)    All other components within normal limits  CBC - Abnormal; Notable for the following components:   MCV 101.0 (*)    MCH 34.5 (*)    All other components within normal limits  URINALYSIS, ROUTINE W REFLEX MICROSCOPIC - Abnormal; Notable for the following components:   Glucose, UA >=500 (*)    Ketones, ur 80 (*)    Squamous Epithelial / LPF 0-5 (*)    All other components within normal limits  CBG MONITORING, ED - Abnormal; Notable for the following components:   Glucose-Capillary 507 (*)     All other components within normal limits  CBG MONITORING, ED - Abnormal; Notable for the following components:   Glucose-Capillary 474 (*)    All other components within normal limits  BASIC METABOLIC PANEL  BASIC METABOLIC PANEL  BASIC METABOLIC PANEL  BASIC METABOLIC PANEL  CBC  HEMOGLOBIN A1C    EKG None  Radiology No results found.  Procedures .Critical Care Performed by: Volanda Napoleon, PA-C Authorized by: Volanda Napoleon, PA-C   Critical care provider statement:    Critical care time (minutes):  35   Critical care time was exclusive of:  Separately billable procedures and treating other patients   Critical care was necessary to treat or prevent imminent or life-threatening deterioration of the following conditions:  Cardiac failure, renal failure, circulatory failure, endocrine  crisis and dehydration   Critical care was time spent personally by me on the following activities:  Blood draw for specimens, ordering and performing treatments and interventions, development of treatment plan with patient or surrogate and ordering and review of laboratory studies   (including critical care time)  Medications Ordered in ED Medications  insulin regular (NOVOLIN R,HUMULIN R) 100 Units in sodium chloride 0.9 % 100 mL (1 Units/mL) infusion (4.1 Units/hr Intravenous New Bag/Given 08/21/17 1701)  dextrose 5 %-0.45 % sodium chloride infusion (has no administration in time range)  heparin injection 5,000 Units (has no administration in time range)  0.9 %  sodium chloride infusion ( Intravenous New Bag/Given 08/21/17 1655)  ondansetron (ZOFRAN) injection 4 mg (has no administration in time range)  diphenhydrAMINE (BENADRYL) injection 12.5 mg (has no administration in time range)  insulin aspart (novoLOG) injection 8 Units (0 Units Subcutaneous Hold 08/21/17 1702)     Initial Impression / Assessment and Plan / ED Course  I have reviewed the triage vital signs and the nursing  notes.  Pertinent labs & imaging results that were available during my care of the patient were reviewed by me and considered in my medical decision making (see chart for details).  Clinical Course as of Aug 22 1739  Mon Aug 21, 2017  1605 Urinalysis, Routine w reflex microscopic [LL]    Clinical Course User Index [LL] Volanda Napoleon, PA-C    67 year old male who presents for evaluation of hyperglycemia.  Reports weeks of polydipsia, polyuria, fatigue, abdominal pain.  [facility] and had a CBC that was greater than 400 was probably go to the ED for further evaluation.  Patient reports he has been prediabetic for several years.  Does have a strong family history of diabetes. Patient is afebrile, non-toxic appearing, sitting comfortably on examination table. Vital signs reviewed and stable.  Abdomen exam is soft, nondistended.  Generalized tenderness with no focal point.  Initial labs ordered at triage.  BMP shows sodium of 130, potassium of 5.9.  Bicarb is 17.  Glucose is 512.  BUN is 31, creatinine is 1.91.  Patient has an anion gap of 21.  CBC shows no significant leukocytosis, anemia.  Given BMP, concerns for DKA.  Urine shows ketones.  Glucose greater than 500.   Patient started on DKA protocol.  Will plan for admission.  Discussed with hospitalist PA.  Will plan for admission.  Final Clinical Impressions(s) / ED Diagnoses   Final diagnoses:  Diabetic ketoacidosis without coma associated with type 2 diabetes mellitus ([place])  AKI (acute kidney injury) [facility])    ED Discharge Orders    None       Desma Mcgregor 08/21/17 1742    Charlesetta Shanks, MD 08/21/17 1815

## 2017-08-21 NOTE — ED Notes (Signed)
Iv TEAM AT BEDSIDE

## 2017-08-21 NOTE — ED Provider Notes (Signed)
Medical screening examination/treatment/procedure(s) were conducted as a shared visit with non-physician practitioner(s) and myself.  I personally evaluated the patient during the encounter.  EKG Interpretation  Date/Time:  Monday August 21 2017 16:27:19 EDT Ventricular Rate:  84 PR Interval:    QRS Duration: 106 QT Interval:  352 QTC Calculation: 416 R Axis:   -38 Text Interpretation:  Sinus rhythm LVH with secondary repolarization abnormality Anterior Q waves, possibly due to LVH agree. no old comparison Confirmed by Charlesetta Shanks [phone]) on 08/21/2017 6:13:21 PM  Patient has history of diabetes controlled on metformin and diet.  Over the last several days he has been getting increasingly weak.  He reports that he has had large increase in thirst.  He has had some generalized abdominal pain.  No vomiting, no fever, no chest pain or difficulty breathing.  Patient is alert and appropriate.  Status clear.  No respiratory distress.  Heart regular.  No gross rub or gallop.  Lungs clear.  Abdomen soft with mild diffuse discomfort to palpation.  No peripheral edema.  Patient speech and cognitive function are normal.  All movements coordinated purposeful and symmetric.  Patient presents with DKA.  He has acidosis and anion gap with hyperglycemia.  Plan is to initiate insulin drip and admit.  I agree with plan of management.   Charlesetta Shanks, MD 08/21/17 [phone]

## 2017-08-21 NOTE — ED Notes (Signed)
IV team could not get IV. Calling in another Person on IV team. MD made aware

## 2017-08-22 ENCOUNTER — Other Ambulatory Visit: Payer: Self-pay

## 2017-08-22 ENCOUNTER — Encounter (HOSPITAL_COMMUNITY): Payer: Self-pay | Admitting: General Practice

## 2017-08-22 DIAGNOSIS — I10 Essential (primary) hypertension: Secondary | ICD-10-CM | POA: Diagnosis not present

## 2017-08-22 DIAGNOSIS — E875 Hyperkalemia: Secondary | ICD-10-CM | POA: Diagnosis not present

## 2017-08-22 DIAGNOSIS — F909 Attention-deficit hyperactivity disorder, unspecified type: Secondary | ICD-10-CM | POA: Diagnosis not present

## 2017-08-22 DIAGNOSIS — E1110 Type 2 diabetes mellitus with ketoacidosis without coma: Principal | ICD-10-CM

## 2017-08-22 DIAGNOSIS — N179 Acute kidney failure, unspecified: Secondary | ICD-10-CM | POA: Diagnosis not present

## 2017-08-22 LAB — CBG MONITORING, ED
GLUCOSE-CAPILLARY: 134 mg/dL — AB (ref 65–99)
GLUCOSE-CAPILLARY: 144 mg/dL — AB (ref 65–99)
GLUCOSE-CAPILLARY: 174 mg/dL — AB (ref 65–99)
GLUCOSE-CAPILLARY: 182 mg/dL — AB (ref 65–99)
Glucose-Capillary: 113 mg/dL — ABNORMAL HIGH (ref 65–99)
Glucose-Capillary: 189 mg/dL — ABNORMAL HIGH (ref 65–99)
Glucose-Capillary: 127 mg/dL — ABNORMAL HIGH (ref 65–99)
Glucose-Capillary: 114 mg/dL — ABNORMAL HIGH (ref 65–99)
Glucose-Capillary: 289 mg/dL — ABNORMAL HIGH (ref 65–99)
Glucose-Capillary: 392 mg/dL — ABNORMAL HIGH (ref 65–99)
Glucose-Capillary: 275 mg/dL — ABNORMAL HIGH (ref 65–99)

## 2017-08-22 LAB — BASIC METABOLIC PANEL
Anion gap: 10 (ref 5–15)
Anion gap: 11 (ref 5–15)
BUN: 21 mg/dL — AB (ref 6–20)
BUN: 23 mg/dL — AB (ref 6–20)
CALCIUM: 8.4 mg/dL — AB (ref 8.9–10.3)
CHLORIDE: 102 mmol/L (ref 101–111)
CO2: 23 mmol/L (ref 22–32)
CO2: 20 mmol/L — ABNORMAL LOW (ref 22–32)
CREATININE: 1.18 mg/dL (ref 0.61–1.24)
CREATININE: 1.07 mg/dL (ref 0.61–1.24)
Calcium: 8.5 mg/dL — ABNORMAL LOW (ref 8.9–10.3)
Chloride: 102 mmol/L (ref 101–111)
GFR calc Af Amer: >60 mL/min (ref >60)
GFR calc Af Amer: >60 mL/min (ref >60)
GFR calc non Af Amer: >60 mL/min (ref >60)
GFR calc non Af Amer: >60 mL/min (ref >60)
Glucose, Bld: 163 mg/dL — ABNORMAL HIGH (ref 65–99)
Glucose, Bld: 382 mg/dL — ABNORMAL HIGH (ref 65–99)
Potassium: 4.3 mmol/L (ref 3.5–5.1)
Potassium: 4.8 mmol/L (ref 3.5–5.1)
SODIUM: 135 mmol/L (ref 135–145)
Sodium: 133 mmol/L — ABNORMAL LOW (ref 135–145)

## 2017-08-22 LAB — HEMOGLOBIN A1C
HEMOGLOBIN A1C: 11.9 % — AB (ref 4.8–5.6)
Mean Plasma Glucose: 294.83 mg/dL

## 2017-08-22 LAB — MAGNESIUM: MAGNESIUM: 2.2 mg/dL (ref 1.7–2.4)

## 2017-08-22 LAB — GLUCOSE, CAPILLARY: Glucose-Capillary: 305 mg/dL — ABNORMAL HIGH (ref 65–99)

## 2017-08-22 MED ORDER — INSULIN GLARGINE 100 UNIT/ML ~~LOC~~ SOLN
10.0000 [IU] | Freq: Every day | SUBCUTANEOUS | Status: DC
  Administered 2017-08-22: 10 [IU] via SUBCUTANEOUS
  Filled 2017-08-22: qty 0.1

## 2017-08-22 MED ORDER — INSULIN ASPART 100 UNIT/ML ~~LOC~~ SOLN
0.0000 [IU] | Freq: Every day | SUBCUTANEOUS | Status: DC
  Administered 2017-08-22: 4 [IU] via SUBCUTANEOUS

## 2017-08-22 MED ORDER — INSULIN STARTER KIT- PEN NEEDLES (ENGLISH)
1.0000 | Freq: Once | Status: DC
  Filled 2017-08-22: qty 1

## 2017-08-22 MED ORDER — SODIUM CHLORIDE 0.9 % IV BOLUS
500.0000 mL | Freq: Once | INTRAVENOUS | Status: AC
  Administered 2017-08-22: 500 mL via INTRAVENOUS

## 2017-08-22 MED ORDER — INSULIN GLARGINE 100 UNIT/ML ~~LOC~~ SOLN: 18.0000 [IU] | Freq: Every day | SUBCUTANEOUS | Status: DC

## 2017-08-22 MED ORDER — INSULIN ASPART 100 UNIT/ML ~~LOC~~ SOLN
0.0000 [IU] | Freq: Three times a day (TID) | SUBCUTANEOUS | Status: DC
  Administered 2017-08-22 (×2): 8 [IU] via SUBCUTANEOUS
  Administered 2017-08-22: 2 [IU] via SUBCUTANEOUS
  Administered 2017-08-23: 3 [IU] via SUBCUTANEOUS
  Administered 2017-08-23: 11 [IU] via SUBCUTANEOUS
  Filled 2017-08-22 (×3): qty 1

## 2017-08-22 MED ORDER — LIVING WELL WITH DIABETES BOOK
Freq: Once | Status: DC
  Filled 2017-08-22 (×2): qty 1

## 2017-08-22 MED ORDER — INSULIN GLARGINE 100 UNIT/ML ~~LOC~~ SOLN
16.0000 [IU] | Freq: Every day | SUBCUTANEOUS | Status: DC
Start: 2017-08-22 — End: 2017-08-23
  Administered 2017-08-22 – 2017-08-23 (×2): 16 [IU] via SUBCUTANEOUS
  Filled 2017-08-22 (×3): qty 0.16

## 2017-08-22 NOTE — Progress Notes (Signed)
PROGRESS NOTE    Luis Long  JME:[id number] DOB: 14-Jan-1951 DOA: 08/21/2017 PCP: Patient, No Pcp Per      Brief Narrative:  Luis Long is a 67 y.o. M with NIDDM who presents with several weeks dry mouth, no progressing in the last week to polyuria, polydipsia, malaise, headache, and epigastric pain.  Presented to UC where he was found to have hyperglycemia, sent to the ER.  There had AG 21, Bicarb 17, glucose >500.  Started on IV fluids, insulin gtt.     Assessment & Plan:  Type 2 diabetes with hyperglycemia Diabetic ketoacidosis Gap closed overnight.  Now sugar back up to 289 mg/dL, then 8 units insulin given and rose to 392 mg/dL.    Patient with longstanding DM, only on metformin 500 BID.  Hasn't had glucometer for "a long time".  A1c >11%.  Lantus covered on insurance, DM educator has done injection teaching.    -Given IV fluids, recheck BMP -Maintain in hospital to stabilize insulin regimen -Increase Lantus 16 nightly -SSI corrections for now    Acute kidney injury Baseline unknown.  Cr 1.9 on admission, down to 1.2 this morning. -Hold ACEi and metformin  Hyperkalemia From K shift, resolved.  Hypertension -Hold lisinopril pending repeat BMP    DVT prophylaxis: Heparin Code Status: FULL Family Communication: None present MDM and disposition Plan: The below labs and imaging reports were reviewed.  The patient's status is clinically worsening this mroning.  He presented with DKA initially, and acute kidney injury.  Overnight, gap closed and AKI resolved, but sugars nearly >400 again in just 5 hours since stopping drip.  Correction insulin now, fluids and repeat BMP to re-evaluate gap.  If gap stable, but insulin doesn't improve with fluids, correction, will keep overnight.  Repeat labs in AM.       Subjective: Feels tired, but nausea, epigastric pain, headache better.  Appetite better.  No chest pain, dyhspnea, dysuria. No vomiting, confusion.    Objective: Vitals:   08/22/17 1630 08/22/17 1645 08/22/17 1800 08/22/17 1836  BP: (!) 141/76 135/77 122/75 139/78  Pulse: 81 71 69 73  Resp:    18  Temp:    97.8 F (36.6 C)  TempSrc:    Oral  SpO2: 99% 100% 98% 99%    Intake/Output Summary (Last 24 hours) at 08/22/2017 2045 Last data filed at 08/22/2017 1605 Gross per 24 hour  Intake 3324.25 ml  Output -  Net 3324.25 ml   There were no vitals filed for this visit.  Examination: General appearance: Older adult male, alert and in no acute distress.  Lying in bed. HEENT: Anicteric, conjunctiva pink, lids and lashes normal. No nasal deformity, discharge, epistaxis.  Lips dry, OP dry, no oral lesions.   Skin: Warm and dry.  No jaundice.  No suspicious rashes or lesions. Cardiac: RRR, nl S1-S2, no murmurs appreciated.  Capillary refill is brisk.  JVP not visible.  No LE edema.  Radial pulses 2+ and symmetric. Respiratory: Normal respiratory rate and rhythm.  CTAB without rales or wheezes. Abdomen: Abdomen soft.  Mild nonfocal TTP without guadring. No ascites, distension, hepatosplenomegaly.   MSK: No deformities or effusions. Neuro: Awake and alert.  EOMI, moves all extremities. Speech fluent.    Psych: Sensorium intact and responding to questions, attention normal. Affect normal.  Judgment and insight appear normal.    Data Reviewed: I have personally reviewed following labs and imaging studies:  CBC: Recent Labs  Lab 08/21/17 1323  WBC 9.8  HGB 17.0  HCT 49.8  MCV 101.0*  PLT 536   Basic Metabolic Panel: Recent Labs  Lab 08/21/17 1323 08/21/17 2116 08/22/17 0349 08/22/17 1344  NA 130* 134* 135 133*  K 5.9* 4.7 4.3 4.8  CL 92* 103 102 102  CO2 17* 19* 23 20*  GLUCOSE 512* 260* 163* 382*  BUN 31* 26* 21* 23*  CREATININE 1.91* 1.36* 1.18 1.07  CALCIUM 9.3 8.4* 8.4* 8.5*  MG  --   --   --  2.2   GFR: CrCl cannot be calculated (Unknown ideal weight.). Liver Function Tests: No results for input(s): AST, ALT,  ALKPHOS, BILITOT, PROT, ALBUMIN in the last 168 hours. No results for input(s): LIPASE, AMYLASE in the last 168 hours. No results for input(s): AMMONIA in the last 168 hours. Coagulation Profile: No results for input(s): INR, PROTIME in the last 168 hours. Cardiac Enzymes: No results for input(s): CKTOTAL, CKMB, CKMBINDEX, TROPONINI in the last 168 hours. BNP (last 3 results) No results for input(s): PROBNP in the last 8760 hours. HbA1C: Recent Labs    08/22/17 0349  HGBA1C 11.9*   CBG: Recent Labs  Lab 08/22/17 0703 08/22/17 0743 08/22/17 1141 08/22/17 1322 08/22/17 1702  GLUCAP 182* 134* 289* 392* 275*   Lipid Profile: No results for input(s): CHOL, HDL, LDLCALC, TRIG, CHOLHDL, LDLDIRECT in the last 72 hours. Thyroid Function Tests: No results for input(s): TSH, T4TOTAL, FREET4, T3FREE, THYROIDAB in the last 72 hours. Anemia Panel: No results for input(s): VITAMINB12, FOLATE, FERRITIN, TIBC, IRON, RETICCTPCT in the last 72 hours. Urine analysis:    Component Value Date/Time   COLORURINE YELLOW 08/21/2017 1316   APPEARANCEUR CLEAR 08/21/2017 1316   LABSPEC 1.024 08/21/2017 1316   PHURINE 5.0 08/21/2017 1316   GLUCOSEU >=500 (A) 08/21/2017 1316   HGBUR NEGATIVE 08/21/2017 1316   BILIRUBINUR NEGATIVE 08/21/2017 1316   KETONESUR 80 (A) 08/21/2017 1316   PROTEINUR NEGATIVE 08/21/2017 1316   NITRITE NEGATIVE 08/21/2017 1316   LEUKOCYTESUR NEGATIVE 08/21/2017 1316   Sepsis Labs: _0 (procalcitonin:4,lacticacidven:4)  )No results found for this or any previous visit (from the past 240 hour(s)).       Radiology Studies: No results found.      Scheduled Meds: . insulin aspart  0-15 Units Subcutaneous TID WC  . insulin aspart  0-5 Units Subcutaneous QHS  . insulin aspart  8 Units Subcutaneous Once  . insulin glargine  16 Units Subcutaneous Daily  . insulin starter kit- pen needles  1 kit Other Once  . living well with diabetes book   Does not apply  Once   Continuous Infusions:   LOS: 1 day    Time spent: 45 minutes    Edwin Dada, MD [facility] 08/22/2017, 1:40 PM     Pager [phone] --- please page though AMION:  [email or web address] Password [email or web address] If 7PM-7AM, please contact night-coverage

## 2017-08-22 NOTE — Discharge Planning (Signed)
[facility] consulted regarding Lantus coverage.  EDCM confirmed that Pt insurance DOES cover Lantus.

## 2017-08-22 NOTE — ED Notes (Signed)
Pt. To be D/C from insulin drip @ 0739.

## 2017-08-22 NOTE — Care Management Obs Status (Signed)
[facility] NOTIFICATION   Patient Details  Name: Luis Long MRN: [id number] Date of Birth: 09/27/1950   Medicare Observation Status Notification Given:  Yes    Fuller Mandril, RN 08/22/2017, 12:28 PM

## 2017-08-22 NOTE — ED Notes (Addendum)
Luis Najjar, MD paged about CBG result and anion gap. Will continue to monitor.

## 2017-08-22 NOTE — ED Notes (Signed)
This RN spoke with Dr. Loleta Books on phone who was notified that pt had questions about admission and what the next step is. Dr. Loleta Books to see pt soon.

## 2017-08-22 NOTE — Care Management CC44 (Signed)
Condition Code 44 Documentation Completed  Patient Details  Name: Luis Long MRN: [id number] Date of Birth: 1951-04-28   Condition Code 44 given:  Yes Patient signature on Condition Code 44 notice:  Yes Documentation of 2 MD's agreement:  Yes Code 44 added to claim:  Yes    Fuller Mandril, RN 08/22/2017, 12:28 PM

## 2017-08-22 NOTE — ED Notes (Signed)
SPO2 dropped to low 80s. While sleeping. Pt. Placed on 2L. SPO2 97%. Will continue to monitor.

## 2017-08-22 NOTE — ED Notes (Signed)
Carb Modified Breakfast Tray Ordered @ [id number] RN-called by Levada Dy

## 2017-08-22 NOTE — ED Notes (Signed)
Dr. Danford at bedside  

## 2017-08-22 NOTE — Progress Notes (Addendum)
Inpatient Diabetes Program Recommendations  AACE/ADA: New Consensus Statement on Inpatient Glycemic Control (2015)  Target Ranges:  Prepandial:   less than 140 mg/dL      Peak postprandial:   less than 180 mg/dL (1-2 hours)      Critically ill patients:  140 - 180 mg/dL   Lab Results  Component Value Date   GLUCAP 134 (H) 08/22/2017   HGBA1C 11.9 (H) 08/22/2017    Review of Glycemic Control  Diabetes history: DM2 Outpatient Diabetes medications: Metformin 500 mg bid Current orders for Inpatient glycemic control: Lantus 10 units + Novolog correction moderate tid  Inpatient Diabetes Program Recommendations:   Received consult and will see patient when transferred to unit. Spoke with RN Chelsea Felts to request a patient weight to review insulin regimen dosing. Chelsea Felts RN returned call to give patient's current weight of 177 lbs (80.45 kg) Based on weight, Recommend increase in Lantus to 16 units daily (0.2 units/kg x 80.45 kg). Novolog 5 units tid meal coverage if eats 50%  12:00 Spoke with Dr. Danford by phone. Case management consult placed for evaluation of cost of Lantus on patient's current coverage of Medicare. If Lantus too costly for patient, may convert to Novolin 70/30 insulin from [employer] (approx. $25 per vial) 12 units bid ac meals =16.8 basal + 7.2 units meal  coverage  12:15 Met with patient @ bedside. Educated patient on insulin pen use at home. Reviewed contents of insulin flexpen starter kit. Reviewed all steps of insulin pen including attachment of needle, 2-unit air shot, dialing up dose, giving injection, removing needle, disposal of sharps, storage of unused insulin, disposal of insulin etc. Patient able to provide successful return demonstration. Also reviewed troubleshooting with insulin pen. MD to give patient Rxs for insulin pens and insulin pen needles. Patient also needs prescription for glucometer & strips. (# for orders provided to MD). Discussed A1c  of 10.9 (approx. 300 blood glucose over the past 2-3 months)   Thank you, Judy E. Hanks, RN, MSN, CDE  Diabetes Coordinator Inpatient Glycemic Control Team Team Pager #[phone] (8am-5pm) 08/22/2017 9:02 AM     

## 2017-08-23 DIAGNOSIS — N179 Acute kidney failure, unspecified: Secondary | ICD-10-CM | POA: Diagnosis not present

## 2017-08-23 DIAGNOSIS — Z794 Long term (current) use of insulin: Secondary | ICD-10-CM | POA: Diagnosis not present

## 2017-08-23 DIAGNOSIS — E1121 Type 2 diabetes mellitus with diabetic nephropathy: Secondary | ICD-10-CM | POA: Diagnosis not present

## 2017-08-23 DIAGNOSIS — E1110 Type 2 diabetes mellitus with ketoacidosis without coma: Secondary | ICD-10-CM | POA: Diagnosis not present

## 2017-08-23 DIAGNOSIS — I10 Essential (primary) hypertension: Secondary | ICD-10-CM | POA: Diagnosis not present

## 2017-08-23 LAB — BASIC METABOLIC PANEL
Anion gap: 8 (ref 5–15)
BUN: 18 mg/dL (ref 6–20)
CALCIUM: 8.4 mg/dL — AB (ref 8.9–10.3)
CO2: 22 mmol/L (ref 22–32)
CREATININE: 0.96 mg/dL (ref 0.61–1.24)
Chloride: 106 mmol/L (ref 101–111)
GFR calc non Af Amer: >60 mL/min (ref >60)
Glucose, Bld: 197 mg/dL — ABNORMAL HIGH (ref 65–99)
Potassium: 4 mmol/L (ref 3.5–5.1)
SODIUM: 136 mmol/L (ref 135–145)

## 2017-08-23 LAB — GLUCOSE, CAPILLARY
Glucose-Capillary: 182 mg/dL — ABNORMAL HIGH (ref 65–99)
Glucose-Capillary: 308 mg/dL — ABNORMAL HIGH (ref 65–99)

## 2017-08-23 MED ORDER — INSULIN GLARGINE 100 UNITS/ML SOLOSTAR PEN: 16.0000 [IU] | PEN_INJECTOR | Freq: Every day | SUBCUTANEOUS | 1 refills | Status: DC

## 2017-08-23 MED ORDER — INSULIN ASPART 100 UNIT/ML ~~LOC~~ SOLN: 0.0000 [IU] | Freq: Three times a day (TID) | SUBCUTANEOUS | 11 refills | Status: DC

## 2017-08-23 MED ORDER — INSULIN PEN NEEDLE 31G X 5 MM MISC: 1.0000 | Freq: Every day | 6 refills | Status: DC

## 2017-08-23 MED ORDER — METFORMIN HCL 500 MG PO TABS: 1000.0000 mg | ORAL_TABLET | Freq: Two times a day (BID) | ORAL | 3 refills | Status: AC

## 2017-08-23 MED ORDER — LIVING WELL WITH DIABETES BOOK: 1.0000 | Freq: Once | 0 refills | Status: AC

## 2017-08-23 MED ORDER — INSULIN STARTER KIT- PEN NEEDLES (ENGLISH)
1.0000 | Freq: Once | 0 refills | Status: AC
Start: 2017-08-23 — End: 2017-08-23

## 2017-08-23 MED ORDER — BLOOD GLUCOSE METER KIT: PACK | 6 refills | Status: DC

## 2017-08-23 MED ORDER — MUSCLE RUB 10-15 % EX CREA
TOPICAL_CREAM | CUTANEOUS | Status: DC | PRN
  Administered 2017-08-23: 12:00:00 via TOPICAL
  Filled 2017-08-23: qty 85

## 2017-08-23 MED ORDER — INSULIN GLARGINE 100 UNIT/ML ~~LOC~~ SOLN: 16.0000 [IU] | Freq: Every day | SUBCUTANEOUS | 11 refills | Status: DC

## 2017-08-23 NOTE — Discharge Summary (Signed)
Physician Discharge Summary  Luis Long OZD:[id number] DOB: February 19, 1951 DOA: 08/21/2017  PCP: Lujean Amel, MD  Admit date: 08/21/2017 Discharge date: 08/23/2017  Admitted From: Disposition:    Recommendations for Outpatient Follow-up:  1. Follow up with PCP in 1-2 weeks 2. Please obtain BMP/CBC in one week your next doctors visit.  3. Diabetic education provided, patient has been advised to keep log of blood sugars 3 times before meals and bedtime 4. Lantus pen has been prescribed, advised to take metformin 1000 milligrams twice daily.  Home Health: None Equipment/Devices: None Discharge Condition: Stable CODE STATUS: Full code Diet recommendation: Diabetic diet  Brief/Interim Summary: 67 year old male with non-insulin-dependent diabetes mellitus came to the hospital with complains of several weeks of a dry mouth, polyuria polydipsia malaise and epigastric pain.  He presented to the urgent care clinic with these complaints and was sent to the ER due to concerns of diabetic ketoacidosis.  Patient initially had elevated anion gap of 21, bicarb of 17 and blood glucose greater than 500.  He was started on insulin drip but overnight his insulin gap closed and was transitioned to subcutaneous Lantus.  His hemoglobin A1c was greater than 11, diabetic coordinator have provided the education.  He was prescribed a glucometer kit, pen.  Had metformin was increased to thousand milligrams twice daily and started on Lantus. His blood glucose stayed in relatively controlled range between 150-200, he is advised to bring this down slowly to normal range as he may start feeling symptoms of relative hypoglycemia brought down to quickly. Initially his creatinine was also elevated at 1.9 but with trended down with IV fluids.  At this time patient has reached maximum benefit from an hospital stay and is stable to be discharged with outpatient follow-up recommendations as stated above  Discharge Diagnoses:   Principal Problem:   DKA (diabetic ketoacidoses) ([place]) Active Problems:   HTN (hypertension)   Hyponatremia   Hyperkalemia   Acute kidney injury ([place])   ADHD  Uncontrolled diabetes mellitus type 2, insulin-dependent Diabetic ketoacidosis, resolved -At this point patient metformin is increased to thousand milligrams twice daily and Lantus 16 units daily has been started -Insulin pen, start a prescription has been provided.  Supplies and glucometer has been given as well.  Education provided by diabetic coordinator -Patient is advised to keep log of his blood sugar 3 times daily before meal and bedtime.  Needs to follow-up outpatient with his primary care physician for further adjustment and recommendations - At this point he can continue his home lisinopril  Acute kidney injury -Initial creatinine was 1.9, it is trended down to his baseline -We will resume lisinopril 10 mg daily  Essential hypertension -Continue lisinopril 10 mg daily  Discharge Instructions  Discharge Instructions    Diet - low sodium heart healthy   Complete by:  As directed    Discharge instructions   Complete by:  As directed    From Dr. Loleta Books: You were admitted for DKA or "diabetic ketoacidosis".  This is a condition of runaway diabetes, in which the body is unable to produce enough insulin to counteract very high sugars leading to acid buildup in the blood and, if untreated, death. You also had a kidney injury, which is common with DKA.  Treatment is with an insulin infusion and lots of IV fluids. In your case, these fluids and insulin corrected the situation completely overnight, but it is clear that your sugars are already going up, and it will be necessary for you to  substantially increase your current therapy for diabetes.  Continuing restricting carbohydrate intake is key (avoid breads, starches, potatoes, pastas, and sugars/sweets/fruit juices). Continue metformin, but gradually increase the dose  over the next few days to 1000 mg twice daily. Take with food to avoid diarrhea.  Start Lantus 16 units injected into the skin at night. Check your blood sugar each morning when you wake up. If your morning blood sugar is over 200 mg/dL, increase the lantus by 2 units.  You can do this every 2 days.  Call Dr. Versie Starks office for a follow up appointment.   Hold your lisinopril for the next three days, then restart it.   Increase activity slowly   Complete by:  As directed      Allergies as of 08/23/2017      Reactions   Erythromycin Nausea Only   Vicodin [hydrocodone-acetaminophen] Itching      Medication List    STOP taking these medications   OVER THE COUNTER MEDICATION     TAKE these medications   amphetamine-dextroamphetamine 10 MG tablet Commonly known as:  ADDERALL Take 10 mg by mouth daily.   BIOFLEX PO Take 1 tablet by mouth daily.   Biotin 10 MG Tabs Take 1 mg by mouth daily.   blood glucose meter kit and supplies Dispense based on patient and insurance preference. Use up to four times daily as directed. (FOR ICD-10 E10.9, E11.9).   insulin glargine 100 unit/mL Sopn Commonly known as:  LANTUS Inject 0.16 mLs (16 Units total) into the skin at bedtime.   Insulin Pen Needle 31G X 5 MM Misc 1 each by Does not apply route daily.   insulin starter kit- pen needles Misc 1 kit by Other route once for 1 dose.   lisinopril 10 MG tablet Commonly known as:  PRINIVIL,ZESTRIL Take 10 mg by mouth daily.   living well with diabetes book Misc 1 each by Does not apply route once for 1 dose.   metFORMIN 500 MG tablet Commonly known as:  GLUCOPHAGE Take 2 tablets (1,000 mg total) by mouth 2 (two) times daily. What changed:  how much to take   MULTI FOR HIM 50+ Tabs Take 1 tablet by mouth daily.   saw palmetto 80 MG capsule Take 160 mg by mouth daily.   vitamin E 400 UNIT capsule Take 400 Units by mouth daily.      Follow-up Information    Koirala, Dibas,  MD Follow up in 1 week(s).   Specialty:  Family Medicine Contact information: [address] [place] [address] [phone]          Allergies  Allergen Reactions  . Erythromycin Nausea Only  . Vicodin [Hydrocodone-Acetaminophen] Itching    You were cared for by a hospitalist during your hospital stay. If you have any questions about your discharge medications or the care you received while you were in the hospital after you are discharged, you can call the unit and asked to speak with the hospitalist on call if the hospitalist that took care of you is not available. Once you are discharged, your primary care physician will handle any further medical issues. Please note that no refills for any discharge medications will be authorized once you are discharged, as it is imperative that you return to your primary care physician (or establish a relationship with a primary care physician if you do not have one) for your aftercare needs so that they can reassess your need for medications and  monitor your lab values.  Consultations:  Diabetic coordinator   Procedures/Studies:  No results found.   Subjective:   Discharge Exam: Vitals:   08/22/17 2328 08/23/17 0746  BP: 135/77 (!) 141/83  Pulse: 80 66  Resp: 16 18  Temp: 98.5 F (36.9 C) 97.7 F (36.5 C)  SpO2: 99% 99%   Vitals:   08/22/17 1836 08/22/17 2328 08/23/17 0746 08/23/17 0752  BP: 139/78 135/77 (!) 141/83   Pulse: 73 80 66   Resp: '18 16 18   ' Temp: 97.8 F (36.6 C) 98.5 F (36.9 C) 97.7 F (36.5 C)   TempSrc: Oral Oral Oral   SpO2: 99% 99% 99%   Weight:    81.5 kg (179 lb 10.8 oz)  Height:    '5\' 9"'  (1.753 m)    General: Pt is alert, awake, not in acute distress Cardiovascular: RRR, S1/S2 +, no rubs, no gallops Respiratory: CTA bilaterally, no wheezing, no rhonchi Abdominal: Soft, NT, ND, bowel sounds + Extremities: no edema, no cyanosis    The results of significant diagnostics  from this hospitalization (including imaging, microbiology, ancillary and laboratory) are listed below for reference.     Microbiology: No results found for this or any previous visit (from the past 240 hour(s)).   Labs: BNP (last 3 results) No results for input(s): BNP in the last 8760 hours. Basic Metabolic Panel: Recent Labs  Lab 08/21/17 1323 08/21/17 2116 08/22/17 0349 08/22/17 1344 08/23/17 0257  NA 130* 134* 135 133* 136  K 5.9* 4.7 4.3 4.8 4.0  CL 92* 103 102 102 106  CO2 17* 19* 23 20* 22  GLUCOSE 512* 260* 163* 382* 197*  BUN 31* 26* 21* 23* 18  CREATININE 1.91* 1.36* 1.18 1.07 0.96  CALCIUM 9.3 8.4* 8.4* 8.5* 8.4*  MG  --   --   --  2.2  --    Liver Function Tests: No results for input(s): AST, ALT, ALKPHOS, BILITOT, PROT, ALBUMIN in the last 168 hours. No results for input(s): LIPASE, AMYLASE in the last 168 hours. No results for input(s): AMMONIA in the last 168 hours. CBC: Recent Labs  Lab 08/21/17 1323  WBC 9.8  HGB 17.0  HCT 49.8  MCV 101.0*  PLT 258   Cardiac Enzymes: No results for input(s): CKTOTAL, CKMB, CKMBINDEX, TROPONINI in the last 168 hours. BNP: Invalid input(s): POCBNP CBG: Recent Labs  Lab 08/22/17 1322 08/22/17 1702 08/22/17 2127 08/23/17 0747 08/23/17 1141  GLUCAP 392* 275* 305* 182* 308*   D-Dimer No results for input(s): DDIMER in the last 72 hours. Hgb A1c Recent Labs    08/22/17 0349  HGBA1C 11.9*   Lipid Profile No results for input(s): CHOL, HDL, LDLCALC, TRIG, CHOLHDL, LDLDIRECT in the last 72 hours. Thyroid function studies No results for input(s): TSH, T4TOTAL, T3FREE, THYROIDAB in the last 72 hours.  Invalid input(s): FREET3 Anemia work up No results for input(s): VITAMINB12, FOLATE, FERRITIN, TIBC, IRON, RETICCTPCT in the last 72 hours. Urinalysis    Component Value Date/Time   COLORURINE YELLOW 08/21/2017 1316   APPEARANCEUR CLEAR 08/21/2017 1316   LABSPEC 1.024 08/21/2017 1316   PHURINE 5.0  08/21/2017 1316   GLUCOSEU >=500 (A) 08/21/2017 1316   HGBUR NEGATIVE 08/21/2017 1316   BILIRUBINUR NEGATIVE 08/21/2017 1316   KETONESUR 80 (A) 08/21/2017 1316   PROTEINUR NEGATIVE 08/21/2017 1316   NITRITE NEGATIVE 08/21/2017 1316   LEUKOCYTESUR NEGATIVE 08/21/2017 1316   Sepsis Labs Invalid input(s): PROCALCITONIN,  WBC,  LACTICIDVEN Microbiology No results  found for this or any previous visit (from the past 240 hour(s)).   Time coordinating discharge: 34 mins  SIGNED:   Damita Lack, MD  [facility] 08/23/2017, 2:10 PM Pager   If 7PM-7AM, please contact night-coverage [email or web address] Password [email or web address]

## 2017-08-23 NOTE — Progress Notes (Signed)
Met with patient @ bedside to review any diabetes questions. Patient states he does not have any further questions @ this time and has information to take home to continue to read. His sister in law (who has diabetes) is coming to take him home and to the grocery store to assist him.  Thank you, Nani Gasser. Jossilyn Benda, RN, MSN, CDE  Diabetes Coordinator Inpatient Glycemic Control Team Team Pager [phone] (8am-5pm) 08/23/2017 3:37 PM

## 2017-08-24 DIAGNOSIS — I10 Essential (primary) hypertension: Secondary | ICD-10-CM | POA: Diagnosis not present

## 2017-08-24 DIAGNOSIS — E119 Type 2 diabetes mellitus without complications: Secondary | ICD-10-CM | POA: Diagnosis not present

## 2017-08-24 DIAGNOSIS — Z79899 Other long term (current) drug therapy: Secondary | ICD-10-CM | POA: Diagnosis not present

## 2017-08-24 DIAGNOSIS — F900 Attention-deficit hyperactivity disorder, predominantly inattentive type: Secondary | ICD-10-CM | POA: Diagnosis not present

## 2017-08-24 DIAGNOSIS — Z0000 Encounter for general adult medical examination without abnormal findings: Secondary | ICD-10-CM | POA: Diagnosis not present

## 2017-08-24 DIAGNOSIS — Z125 Encounter for screening for malignant neoplasm of prostate: Secondary | ICD-10-CM | POA: Diagnosis not present

## 2017-08-24 DIAGNOSIS — E7800 Pure hypercholesterolemia, unspecified: Secondary | ICD-10-CM | POA: Diagnosis not present

## 2017-09-07 DIAGNOSIS — E119 Type 2 diabetes mellitus without complications: Secondary | ICD-10-CM | POA: Diagnosis not present

## 2017-09-07 DIAGNOSIS — Z79899 Other long term (current) drug therapy: Secondary | ICD-10-CM | POA: Diagnosis not present

## 2017-09-07 DIAGNOSIS — I10 Essential (primary) hypertension: Secondary | ICD-10-CM | POA: Diagnosis not present

## 2017-09-07 DIAGNOSIS — E7800 Pure hypercholesterolemia, unspecified: Secondary | ICD-10-CM | POA: Diagnosis not present

## 2017-09-18 DIAGNOSIS — E119 Type 2 diabetes mellitus without complications: Secondary | ICD-10-CM | POA: Diagnosis not present

## 2017-10-03 DIAGNOSIS — I10 Essential (primary) hypertension: Secondary | ICD-10-CM | POA: Diagnosis not present

## 2017-10-03 DIAGNOSIS — E1165 Type 2 diabetes mellitus with hyperglycemia: Secondary | ICD-10-CM | POA: Diagnosis not present

## 2017-10-03 DIAGNOSIS — E7800 Pure hypercholesterolemia, unspecified: Secondary | ICD-10-CM | POA: Diagnosis not present

## 2017-10-04 DIAGNOSIS — E1165 Type 2 diabetes mellitus with hyperglycemia: Secondary | ICD-10-CM | POA: Diagnosis not present

## 2017-10-17 ENCOUNTER — Encounter: Payer: Self-pay | Admitting: Dietician

## 2017-10-17 ENCOUNTER — Encounter: Payer: Medicare Other | Attending: Family Medicine | Admitting: Dietician

## 2017-10-17 DIAGNOSIS — E119 Type 2 diabetes mellitus without complications: Secondary | ICD-10-CM | POA: Diagnosis not present

## 2017-10-17 DIAGNOSIS — Z713 Dietary counseling and surveillance: Secondary | ICD-10-CM | POA: Insufficient documentation

## 2017-10-17 NOTE — Progress Notes (Signed)
Patient was seen on 10/17/17 for the first of a series of three diabetes self-management courses at the [facility].  Patient Education Plan per assessed needs and concerns is to attend three course education program for Diabetes Self Management Education.  The following learning objectives were met by the patient during this class:  Describe diabetes  State some common risk factors for diabetes  Defines the role of glucose and insulin  Identifies type of diabetes and pathophysiology  Describe the relationship between diabetes and cardiovascular risk  State the members of the Healthcare Team  States the rationale for glucose monitoring  State when to test glucose  State their individual Target Range  State the importance of logging glucose readings  Describe how to interpret glucose readings  Identifies A1C target  Explain the correlation between A1c and eAG values  State symptoms and treatment of high blood glucose  State symptoms and treatment of low blood glucose  Explain proper technique for glucose testing  Identifies proper sharps disposal  Handouts given during class include:  ADA Diabetes You Take Control   Carb Counting and Meal Planning book  Meal Plan Card  Meal planning worksheet  Low Sodium Flavoring Tips  Types of Fats  The diabetes portion plate  A1c to eAG Conversion Chart  Diabetes Recommended Care Schedule  Support Group  Diabetes Success Plan  Core Class Satisfaction Survey   Follow-Up Plan:  Attend core 2   

## 2017-10-23 DIAGNOSIS — E119 Type 2 diabetes mellitus without complications: Secondary | ICD-10-CM | POA: Diagnosis not present

## 2017-10-23 DIAGNOSIS — I10 Essential (primary) hypertension: Secondary | ICD-10-CM | POA: Diagnosis not present

## 2017-10-23 DIAGNOSIS — F900 Attention-deficit hyperactivity disorder, predominantly inattentive type: Secondary | ICD-10-CM | POA: Diagnosis not present

## 2017-10-24 ENCOUNTER — Encounter: Payer: Medicare Other | Admitting: Dietician

## 2017-10-24 DIAGNOSIS — E119 Type 2 diabetes mellitus without complications: Secondary | ICD-10-CM | POA: Diagnosis not present

## 2017-10-24 DIAGNOSIS — Z713 Dietary counseling and surveillance: Secondary | ICD-10-CM | POA: Diagnosis not present

## 2017-10-24 NOTE — Progress Notes (Signed)
Patient was seen on 10/24/17 for the second of a series of three diabetes self-management courses at the [facility]. The following learning objectives were met by the patient during this class:   Describe the role of different macronutrients on glucose  Explain how carbohydrates affect blood glucose  State what foods contain the most carbohydrates  Demonstrate carbohydrate counting  Demonstrate how to read Nutrition Facts food label  Describe effects of various fats on heart health  Describe the importance of good nutrition for health and healthy eating strategies  Describe techniques for managing your shopping, cooking and meal planning  List strategies to follow meal plan when dining out  Describe the effects of alcohol on glucose and how to use it safely  Goals:  Follow Diabetes Meal Plan as instructed  Aim to spread carbs evenly throughout the day  Aim for 3 meals per day and snacks as needed Include lean protein foods to meals/snacks  Monitor glucose levels as instructed by your doctor   Follow-Up Plan:  Attend Core 3  Work towards following your personal food plan.

## 2017-10-31 ENCOUNTER — Encounter: Payer: Self-pay | Admitting: Dietician

## 2017-10-31 ENCOUNTER — Encounter: Payer: Medicare Other | Admitting: Dietician

## 2017-10-31 DIAGNOSIS — E119 Type 2 diabetes mellitus without complications: Secondary | ICD-10-CM

## 2017-10-31 DIAGNOSIS — Z713 Dietary counseling and surveillance: Secondary | ICD-10-CM | POA: Diagnosis not present

## 2017-10-31 NOTE — Progress Notes (Addendum)
Patient was seen on 10/31/17 for the third of a series of three diabetes self-management courses at the [facility].   Catalina Gravel the amount of activity recommended for healthy living . Describe activities suitable for individual needs . Identify ways to regularly incorporate activity into daily life . Identify barriers to activity and ways to over come these barriers  Identify diabetes medications being personally used and their primary action for lowering glucose and possible side effects . Describe role of stress on blood glucose and develop strategies to address psychosocial issues . Identify diabetes complications and ways to prevent them  Explain how to manage diabetes during illness . Evaluate success in meeting personal goal . Establish 2-3 goals that they will plan to diligently work on  Goals:   I will count my carb choices at most meals and snacks  I will be active 30 minutes or more 3 times a week  I will take my diabetes medications as scheduled  I will eat less unhealthy fats by eating less carbs, butter, baking less  I will reduce my stress by signing up for [facility] at the [place] patient has identified these potential barriers to change:  none  Your patient has identified their diabetes self-care support plan as  Family Support On-line resources   Plan:  Attend Support Group as desired

## 2018-01-01 DIAGNOSIS — I10 Essential (primary) hypertension: Secondary | ICD-10-CM | POA: Diagnosis not present

## 2018-01-01 DIAGNOSIS — E1165 Type 2 diabetes mellitus with hyperglycemia: Secondary | ICD-10-CM | POA: Diagnosis not present

## 2018-02-08 DIAGNOSIS — Z23 Encounter for immunization: Secondary | ICD-10-CM | POA: Diagnosis not present

## 2018-02-23 ENCOUNTER — Other Ambulatory Visit: Payer: Self-pay | Admitting: Family Medicine

## 2018-02-23 ENCOUNTER — Ambulatory Visit
Admission: RE | Admit: 2018-02-23 | Discharge: 2018-02-23 | Disposition: A | Discharge status: Home / Self Care | Payer: Medicare Other | Source: Ambulatory Visit | Attending: Family Medicine | Admitting: Family Medicine

## 2018-02-23 DIAGNOSIS — M25561 Pain in right knee: Secondary | ICD-10-CM | POA: Diagnosis not present

## 2018-02-23 DIAGNOSIS — M25461 Effusion, right knee: Secondary | ICD-10-CM

## 2018-03-09 DIAGNOSIS — M79671 Pain in right foot: Secondary | ICD-10-CM | POA: Diagnosis not present

## 2018-03-09 DIAGNOSIS — M25561 Pain in right knee: Secondary | ICD-10-CM | POA: Insufficient documentation

## 2018-04-04 DIAGNOSIS — M25561 Pain in right knee: Secondary | ICD-10-CM | POA: Diagnosis not present

## 2018-04-09 DIAGNOSIS — M79671 Pain in right foot: Secondary | ICD-10-CM | POA: Diagnosis not present

## 2018-04-09 DIAGNOSIS — M7741 Metatarsalgia, right foot: Secondary | ICD-10-CM | POA: Diagnosis not present

## 2018-04-09 DIAGNOSIS — F17200 Nicotine dependence, unspecified, uncomplicated: Secondary | ICD-10-CM | POA: Diagnosis not present

## 2018-04-24 DIAGNOSIS — E7800 Pure hypercholesterolemia, unspecified: Secondary | ICD-10-CM | POA: Diagnosis not present

## 2018-04-24 DIAGNOSIS — I10 Essential (primary) hypertension: Secondary | ICD-10-CM | POA: Diagnosis not present

## 2018-04-24 DIAGNOSIS — F900 Attention-deficit hyperactivity disorder, predominantly inattentive type: Secondary | ICD-10-CM | POA: Diagnosis not present

## 2018-04-24 DIAGNOSIS — E119 Type 2 diabetes mellitus without complications: Secondary | ICD-10-CM | POA: Diagnosis not present

## 2018-04-24 DIAGNOSIS — Z7984 Long term (current) use of oral hypoglycemic drugs: Secondary | ICD-10-CM | POA: Diagnosis not present

## 2018-06-27 DIAGNOSIS — E1165 Type 2 diabetes mellitus with hyperglycemia: Secondary | ICD-10-CM | POA: Diagnosis not present

## 2018-06-27 DIAGNOSIS — I10 Essential (primary) hypertension: Secondary | ICD-10-CM | POA: Diagnosis not present

## 2018-06-27 DIAGNOSIS — E7800 Pure hypercholesterolemia, unspecified: Secondary | ICD-10-CM | POA: Diagnosis not present

## 2018-07-04 DIAGNOSIS — I10 Essential (primary) hypertension: Secondary | ICD-10-CM | POA: Diagnosis not present

## 2018-07-04 DIAGNOSIS — E1165 Type 2 diabetes mellitus with hyperglycemia: Secondary | ICD-10-CM | POA: Diagnosis not present

## 2018-08-14 DIAGNOSIS — M546 Pain in thoracic spine: Secondary | ICD-10-CM | POA: Diagnosis not present

## 2018-08-14 DIAGNOSIS — M5136 Other intervertebral disc degeneration, lumbar region: Secondary | ICD-10-CM | POA: Diagnosis not present

## 2018-08-14 DIAGNOSIS — M542 Cervicalgia: Secondary | ICD-10-CM | POA: Diagnosis not present

## 2018-08-14 DIAGNOSIS — M5137 Other intervertebral disc degeneration, lumbosacral region: Secondary | ICD-10-CM | POA: Diagnosis not present

## 2018-08-14 DIAGNOSIS — M545 Low back pain: Secondary | ICD-10-CM | POA: Diagnosis not present

## 2018-08-14 DIAGNOSIS — M9902 Segmental and somatic dysfunction of thoracic region: Secondary | ICD-10-CM | POA: Diagnosis not present

## 2018-08-14 DIAGNOSIS — M9903 Segmental and somatic dysfunction of lumbar region: Secondary | ICD-10-CM | POA: Diagnosis not present

## 2018-08-14 DIAGNOSIS — M50323 Other cervical disc degeneration at C6-C7 level: Secondary | ICD-10-CM | POA: Diagnosis not present

## 2018-08-14 DIAGNOSIS — M9901 Segmental and somatic dysfunction of cervical region: Secondary | ICD-10-CM | POA: Diagnosis not present

## 2018-08-20 DIAGNOSIS — M9903 Segmental and somatic dysfunction of lumbar region: Secondary | ICD-10-CM | POA: Diagnosis not present

## 2018-08-20 DIAGNOSIS — M9901 Segmental and somatic dysfunction of cervical region: Secondary | ICD-10-CM | POA: Diagnosis not present

## 2018-08-20 DIAGNOSIS — M5137 Other intervertebral disc degeneration, lumbosacral region: Secondary | ICD-10-CM | POA: Diagnosis not present

## 2018-08-20 DIAGNOSIS — M50323 Other cervical disc degeneration at C6-C7 level: Secondary | ICD-10-CM | POA: Diagnosis not present

## 2018-08-20 DIAGNOSIS — M9902 Segmental and somatic dysfunction of thoracic region: Secondary | ICD-10-CM | POA: Diagnosis not present

## 2018-08-20 DIAGNOSIS — M5136 Other intervertebral disc degeneration, lumbar region: Secondary | ICD-10-CM | POA: Diagnosis not present

## 2018-08-20 DIAGNOSIS — M546 Pain in thoracic spine: Secondary | ICD-10-CM | POA: Diagnosis not present

## 2018-08-20 DIAGNOSIS — M542 Cervicalgia: Secondary | ICD-10-CM | POA: Diagnosis not present

## 2018-08-20 DIAGNOSIS — M545 Low back pain: Secondary | ICD-10-CM | POA: Diagnosis not present

## 2018-08-23 DIAGNOSIS — M9902 Segmental and somatic dysfunction of thoracic region: Secondary | ICD-10-CM | POA: Diagnosis not present

## 2018-08-23 DIAGNOSIS — M542 Cervicalgia: Secondary | ICD-10-CM | POA: Diagnosis not present

## 2018-08-23 DIAGNOSIS — M9901 Segmental and somatic dysfunction of cervical region: Secondary | ICD-10-CM | POA: Diagnosis not present

## 2018-08-23 DIAGNOSIS — M5136 Other intervertebral disc degeneration, lumbar region: Secondary | ICD-10-CM | POA: Diagnosis not present

## 2018-08-23 DIAGNOSIS — M9903 Segmental and somatic dysfunction of lumbar region: Secondary | ICD-10-CM | POA: Diagnosis not present

## 2018-08-23 DIAGNOSIS — M546 Pain in thoracic spine: Secondary | ICD-10-CM | POA: Diagnosis not present

## 2018-08-23 DIAGNOSIS — M545 Low back pain: Secondary | ICD-10-CM | POA: Diagnosis not present

## 2018-08-23 DIAGNOSIS — M5137 Other intervertebral disc degeneration, lumbosacral region: Secondary | ICD-10-CM | POA: Diagnosis not present

## 2018-08-23 DIAGNOSIS — M50323 Other cervical disc degeneration at C6-C7 level: Secondary | ICD-10-CM | POA: Diagnosis not present

## 2018-08-27 DIAGNOSIS — M9902 Segmental and somatic dysfunction of thoracic region: Secondary | ICD-10-CM | POA: Diagnosis not present

## 2018-08-27 DIAGNOSIS — M542 Cervicalgia: Secondary | ICD-10-CM | POA: Diagnosis not present

## 2018-08-27 DIAGNOSIS — M50323 Other cervical disc degeneration at C6-C7 level: Secondary | ICD-10-CM | POA: Diagnosis not present

## 2018-08-27 DIAGNOSIS — M5137 Other intervertebral disc degeneration, lumbosacral region: Secondary | ICD-10-CM | POA: Diagnosis not present

## 2018-08-27 DIAGNOSIS — M5136 Other intervertebral disc degeneration, lumbar region: Secondary | ICD-10-CM | POA: Diagnosis not present

## 2018-08-27 DIAGNOSIS — M546 Pain in thoracic spine: Secondary | ICD-10-CM | POA: Diagnosis not present

## 2018-08-27 DIAGNOSIS — M9903 Segmental and somatic dysfunction of lumbar region: Secondary | ICD-10-CM | POA: Diagnosis not present

## 2018-08-27 DIAGNOSIS — M9901 Segmental and somatic dysfunction of cervical region: Secondary | ICD-10-CM | POA: Diagnosis not present

## 2018-08-27 DIAGNOSIS — M545 Low back pain: Secondary | ICD-10-CM | POA: Diagnosis not present

## 2018-08-30 DIAGNOSIS — M50323 Other cervical disc degeneration at C6-C7 level: Secondary | ICD-10-CM | POA: Diagnosis not present

## 2018-08-30 DIAGNOSIS — M9901 Segmental and somatic dysfunction of cervical region: Secondary | ICD-10-CM | POA: Diagnosis not present

## 2018-08-30 DIAGNOSIS — M545 Low back pain: Secondary | ICD-10-CM | POA: Diagnosis not present

## 2018-08-30 DIAGNOSIS — M5137 Other intervertebral disc degeneration, lumbosacral region: Secondary | ICD-10-CM | POA: Diagnosis not present

## 2018-08-30 DIAGNOSIS — M9903 Segmental and somatic dysfunction of lumbar region: Secondary | ICD-10-CM | POA: Diagnosis not present

## 2018-08-30 DIAGNOSIS — M546 Pain in thoracic spine: Secondary | ICD-10-CM | POA: Diagnosis not present

## 2018-08-30 DIAGNOSIS — M5136 Other intervertebral disc degeneration, lumbar region: Secondary | ICD-10-CM | POA: Diagnosis not present

## 2018-08-30 DIAGNOSIS — M542 Cervicalgia: Secondary | ICD-10-CM | POA: Diagnosis not present

## 2018-08-30 DIAGNOSIS — M9902 Segmental and somatic dysfunction of thoracic region: Secondary | ICD-10-CM | POA: Diagnosis not present

## 2018-09-03 DIAGNOSIS — M9902 Segmental and somatic dysfunction of thoracic region: Secondary | ICD-10-CM | POA: Diagnosis not present

## 2018-09-03 DIAGNOSIS — M50323 Other cervical disc degeneration at C6-C7 level: Secondary | ICD-10-CM | POA: Diagnosis not present

## 2018-09-03 DIAGNOSIS — M546 Pain in thoracic spine: Secondary | ICD-10-CM | POA: Diagnosis not present

## 2018-09-03 DIAGNOSIS — M545 Low back pain: Secondary | ICD-10-CM | POA: Diagnosis not present

## 2018-09-03 DIAGNOSIS — M9901 Segmental and somatic dysfunction of cervical region: Secondary | ICD-10-CM | POA: Diagnosis not present

## 2018-09-03 DIAGNOSIS — M542 Cervicalgia: Secondary | ICD-10-CM | POA: Diagnosis not present

## 2018-09-03 DIAGNOSIS — M9903 Segmental and somatic dysfunction of lumbar region: Secondary | ICD-10-CM | POA: Diagnosis not present

## 2018-09-03 DIAGNOSIS — M5136 Other intervertebral disc degeneration, lumbar region: Secondary | ICD-10-CM | POA: Diagnosis not present

## 2018-09-03 DIAGNOSIS — M5137 Other intervertebral disc degeneration, lumbosacral region: Secondary | ICD-10-CM | POA: Diagnosis not present

## 2018-09-06 DIAGNOSIS — M545 Low back pain: Secondary | ICD-10-CM | POA: Diagnosis not present

## 2018-09-06 DIAGNOSIS — M9901 Segmental and somatic dysfunction of cervical region: Secondary | ICD-10-CM | POA: Diagnosis not present

## 2018-09-06 DIAGNOSIS — M5136 Other intervertebral disc degeneration, lumbar region: Secondary | ICD-10-CM | POA: Diagnosis not present

## 2018-09-06 DIAGNOSIS — M542 Cervicalgia: Secondary | ICD-10-CM | POA: Diagnosis not present

## 2018-09-06 DIAGNOSIS — M9902 Segmental and somatic dysfunction of thoracic region: Secondary | ICD-10-CM | POA: Diagnosis not present

## 2018-09-06 DIAGNOSIS — M5137 Other intervertebral disc degeneration, lumbosacral region: Secondary | ICD-10-CM | POA: Diagnosis not present

## 2018-09-06 DIAGNOSIS — M9903 Segmental and somatic dysfunction of lumbar region: Secondary | ICD-10-CM | POA: Diagnosis not present

## 2018-09-06 DIAGNOSIS — M50323 Other cervical disc degeneration at C6-C7 level: Secondary | ICD-10-CM | POA: Diagnosis not present

## 2018-09-06 DIAGNOSIS — M546 Pain in thoracic spine: Secondary | ICD-10-CM | POA: Diagnosis not present

## 2018-09-10 DIAGNOSIS — M9903 Segmental and somatic dysfunction of lumbar region: Secondary | ICD-10-CM | POA: Diagnosis not present

## 2018-09-10 DIAGNOSIS — M545 Low back pain: Secondary | ICD-10-CM | POA: Diagnosis not present

## 2018-09-10 DIAGNOSIS — M5137 Other intervertebral disc degeneration, lumbosacral region: Secondary | ICD-10-CM | POA: Diagnosis not present

## 2018-09-10 DIAGNOSIS — M50323 Other cervical disc degeneration at C6-C7 level: Secondary | ICD-10-CM | POA: Diagnosis not present

## 2018-09-10 DIAGNOSIS — M5136 Other intervertebral disc degeneration, lumbar region: Secondary | ICD-10-CM | POA: Diagnosis not present

## 2018-09-10 DIAGNOSIS — M9902 Segmental and somatic dysfunction of thoracic region: Secondary | ICD-10-CM | POA: Diagnosis not present

## 2018-09-10 DIAGNOSIS — M546 Pain in thoracic spine: Secondary | ICD-10-CM | POA: Diagnosis not present

## 2018-09-10 DIAGNOSIS — M542 Cervicalgia: Secondary | ICD-10-CM | POA: Diagnosis not present

## 2018-09-10 DIAGNOSIS — M9901 Segmental and somatic dysfunction of cervical region: Secondary | ICD-10-CM | POA: Diagnosis not present

## 2018-09-12 DIAGNOSIS — M5137 Other intervertebral disc degeneration, lumbosacral region: Secondary | ICD-10-CM | POA: Diagnosis not present

## 2018-09-12 DIAGNOSIS — M545 Low back pain: Secondary | ICD-10-CM | POA: Diagnosis not present

## 2018-09-12 DIAGNOSIS — M9903 Segmental and somatic dysfunction of lumbar region: Secondary | ICD-10-CM | POA: Diagnosis not present

## 2018-09-12 DIAGNOSIS — M546 Pain in thoracic spine: Secondary | ICD-10-CM | POA: Diagnosis not present

## 2018-09-12 DIAGNOSIS — M9901 Segmental and somatic dysfunction of cervical region: Secondary | ICD-10-CM | POA: Diagnosis not present

## 2018-09-12 DIAGNOSIS — M9902 Segmental and somatic dysfunction of thoracic region: Secondary | ICD-10-CM | POA: Diagnosis not present

## 2018-09-12 DIAGNOSIS — M542 Cervicalgia: Secondary | ICD-10-CM | POA: Diagnosis not present

## 2018-09-12 DIAGNOSIS — M50323 Other cervical disc degeneration at C6-C7 level: Secondary | ICD-10-CM | POA: Diagnosis not present

## 2018-09-12 DIAGNOSIS — M5136 Other intervertebral disc degeneration, lumbar region: Secondary | ICD-10-CM | POA: Diagnosis not present

## 2018-09-17 DIAGNOSIS — M546 Pain in thoracic spine: Secondary | ICD-10-CM | POA: Diagnosis not present

## 2018-09-17 DIAGNOSIS — M50323 Other cervical disc degeneration at C6-C7 level: Secondary | ICD-10-CM | POA: Diagnosis not present

## 2018-09-17 DIAGNOSIS — M5136 Other intervertebral disc degeneration, lumbar region: Secondary | ICD-10-CM | POA: Diagnosis not present

## 2018-09-17 DIAGNOSIS — M9902 Segmental and somatic dysfunction of thoracic region: Secondary | ICD-10-CM | POA: Diagnosis not present

## 2018-09-17 DIAGNOSIS — M545 Low back pain: Secondary | ICD-10-CM | POA: Diagnosis not present

## 2018-09-17 DIAGNOSIS — M5137 Other intervertebral disc degeneration, lumbosacral region: Secondary | ICD-10-CM | POA: Diagnosis not present

## 2018-09-17 DIAGNOSIS — M542 Cervicalgia: Secondary | ICD-10-CM | POA: Diagnosis not present

## 2018-09-17 DIAGNOSIS — M9901 Segmental and somatic dysfunction of cervical region: Secondary | ICD-10-CM | POA: Diagnosis not present

## 2018-09-17 DIAGNOSIS — M9903 Segmental and somatic dysfunction of lumbar region: Secondary | ICD-10-CM | POA: Diagnosis not present

## 2018-09-20 DIAGNOSIS — M545 Low back pain: Secondary | ICD-10-CM | POA: Diagnosis not present

## 2018-09-20 DIAGNOSIS — M9901 Segmental and somatic dysfunction of cervical region: Secondary | ICD-10-CM | POA: Diagnosis not present

## 2018-09-20 DIAGNOSIS — M9903 Segmental and somatic dysfunction of lumbar region: Secondary | ICD-10-CM | POA: Diagnosis not present

## 2018-09-20 DIAGNOSIS — M5136 Other intervertebral disc degeneration, lumbar region: Secondary | ICD-10-CM | POA: Diagnosis not present

## 2018-09-20 DIAGNOSIS — M50323 Other cervical disc degeneration at C6-C7 level: Secondary | ICD-10-CM | POA: Diagnosis not present

## 2018-09-20 DIAGNOSIS — M546 Pain in thoracic spine: Secondary | ICD-10-CM | POA: Diagnosis not present

## 2018-09-20 DIAGNOSIS — M542 Cervicalgia: Secondary | ICD-10-CM | POA: Diagnosis not present

## 2018-09-20 DIAGNOSIS — M5137 Other intervertebral disc degeneration, lumbosacral region: Secondary | ICD-10-CM | POA: Diagnosis not present

## 2018-09-20 DIAGNOSIS — M9902 Segmental and somatic dysfunction of thoracic region: Secondary | ICD-10-CM | POA: Diagnosis not present

## 2018-09-24 DIAGNOSIS — M9902 Segmental and somatic dysfunction of thoracic region: Secondary | ICD-10-CM | POA: Diagnosis not present

## 2018-09-24 DIAGNOSIS — M50323 Other cervical disc degeneration at C6-C7 level: Secondary | ICD-10-CM | POA: Diagnosis not present

## 2018-09-24 DIAGNOSIS — M9901 Segmental and somatic dysfunction of cervical region: Secondary | ICD-10-CM | POA: Diagnosis not present

## 2018-09-24 DIAGNOSIS — M5137 Other intervertebral disc degeneration, lumbosacral region: Secondary | ICD-10-CM | POA: Diagnosis not present

## 2018-09-24 DIAGNOSIS — M5136 Other intervertebral disc degeneration, lumbar region: Secondary | ICD-10-CM | POA: Diagnosis not present

## 2018-09-24 DIAGNOSIS — M542 Cervicalgia: Secondary | ICD-10-CM | POA: Diagnosis not present

## 2018-09-24 DIAGNOSIS — M546 Pain in thoracic spine: Secondary | ICD-10-CM | POA: Diagnosis not present

## 2018-09-24 DIAGNOSIS — M545 Low back pain: Secondary | ICD-10-CM | POA: Diagnosis not present

## 2018-09-24 DIAGNOSIS — M9903 Segmental and somatic dysfunction of lumbar region: Secondary | ICD-10-CM | POA: Diagnosis not present

## 2018-09-27 DIAGNOSIS — H25042 Posterior subcapsular polar age-related cataract, left eye: Secondary | ICD-10-CM | POA: Diagnosis not present

## 2018-09-27 DIAGNOSIS — M546 Pain in thoracic spine: Secondary | ICD-10-CM | POA: Diagnosis not present

## 2018-09-27 DIAGNOSIS — M50323 Other cervical disc degeneration at C6-C7 level: Secondary | ICD-10-CM | POA: Diagnosis not present

## 2018-09-27 DIAGNOSIS — H2511 Age-related nuclear cataract, right eye: Secondary | ICD-10-CM | POA: Diagnosis not present

## 2018-09-27 DIAGNOSIS — M9901 Segmental and somatic dysfunction of cervical region: Secondary | ICD-10-CM | POA: Diagnosis not present

## 2018-09-27 DIAGNOSIS — M9903 Segmental and somatic dysfunction of lumbar region: Secondary | ICD-10-CM | POA: Diagnosis not present

## 2018-09-27 DIAGNOSIS — M5136 Other intervertebral disc degeneration, lumbar region: Secondary | ICD-10-CM | POA: Diagnosis not present

## 2018-09-27 DIAGNOSIS — H25011 Cortical age-related cataract, right eye: Secondary | ICD-10-CM | POA: Diagnosis not present

## 2018-09-27 DIAGNOSIS — M542 Cervicalgia: Secondary | ICD-10-CM | POA: Diagnosis not present

## 2018-09-27 DIAGNOSIS — M545 Low back pain: Secondary | ICD-10-CM | POA: Diagnosis not present

## 2018-09-27 DIAGNOSIS — M5137 Other intervertebral disc degeneration, lumbosacral region: Secondary | ICD-10-CM | POA: Diagnosis not present

## 2018-09-27 DIAGNOSIS — Z961 Presence of intraocular lens: Secondary | ICD-10-CM | POA: Diagnosis not present

## 2018-09-27 DIAGNOSIS — M9902 Segmental and somatic dysfunction of thoracic region: Secondary | ICD-10-CM | POA: Diagnosis not present

## 2018-10-01 DIAGNOSIS — H2511 Age-related nuclear cataract, right eye: Secondary | ICD-10-CM | POA: Diagnosis not present

## 2018-10-11 DIAGNOSIS — M9903 Segmental and somatic dysfunction of lumbar region: Secondary | ICD-10-CM | POA: Diagnosis not present

## 2018-10-11 DIAGNOSIS — M546 Pain in thoracic spine: Secondary | ICD-10-CM | POA: Diagnosis not present

## 2018-10-11 DIAGNOSIS — M5137 Other intervertebral disc degeneration, lumbosacral region: Secondary | ICD-10-CM | POA: Diagnosis not present

## 2018-10-11 DIAGNOSIS — M5136 Other intervertebral disc degeneration, lumbar region: Secondary | ICD-10-CM | POA: Diagnosis not present

## 2018-10-11 DIAGNOSIS — M9901 Segmental and somatic dysfunction of cervical region: Secondary | ICD-10-CM | POA: Diagnosis not present

## 2018-10-11 DIAGNOSIS — M9902 Segmental and somatic dysfunction of thoracic region: Secondary | ICD-10-CM | POA: Diagnosis not present

## 2018-10-11 DIAGNOSIS — M542 Cervicalgia: Secondary | ICD-10-CM | POA: Diagnosis not present

## 2018-10-11 DIAGNOSIS — M50323 Other cervical disc degeneration at C6-C7 level: Secondary | ICD-10-CM | POA: Diagnosis not present

## 2018-10-11 DIAGNOSIS — M545 Low back pain: Secondary | ICD-10-CM | POA: Diagnosis not present

## 2018-10-18 DIAGNOSIS — M545 Low back pain: Secondary | ICD-10-CM | POA: Diagnosis not present

## 2018-10-18 DIAGNOSIS — M9901 Segmental and somatic dysfunction of cervical region: Secondary | ICD-10-CM | POA: Diagnosis not present

## 2018-10-18 DIAGNOSIS — M9902 Segmental and somatic dysfunction of thoracic region: Secondary | ICD-10-CM | POA: Diagnosis not present

## 2018-10-18 DIAGNOSIS — M50323 Other cervical disc degeneration at C6-C7 level: Secondary | ICD-10-CM | POA: Diagnosis not present

## 2018-10-18 DIAGNOSIS — M9903 Segmental and somatic dysfunction of lumbar region: Secondary | ICD-10-CM | POA: Diagnosis not present

## 2018-10-18 DIAGNOSIS — M542 Cervicalgia: Secondary | ICD-10-CM | POA: Diagnosis not present

## 2018-10-18 DIAGNOSIS — M5136 Other intervertebral disc degeneration, lumbar region: Secondary | ICD-10-CM | POA: Diagnosis not present

## 2018-10-18 DIAGNOSIS — M546 Pain in thoracic spine: Secondary | ICD-10-CM | POA: Diagnosis not present

## 2018-10-18 DIAGNOSIS — M5137 Other intervertebral disc degeneration, lumbosacral region: Secondary | ICD-10-CM | POA: Diagnosis not present

## 2018-10-25 DIAGNOSIS — M5137 Other intervertebral disc degeneration, lumbosacral region: Secondary | ICD-10-CM | POA: Diagnosis not present

## 2018-10-25 DIAGNOSIS — M546 Pain in thoracic spine: Secondary | ICD-10-CM | POA: Diagnosis not present

## 2018-10-25 DIAGNOSIS — M9903 Segmental and somatic dysfunction of lumbar region: Secondary | ICD-10-CM | POA: Diagnosis not present

## 2018-10-25 DIAGNOSIS — M545 Low back pain: Secondary | ICD-10-CM | POA: Diagnosis not present

## 2018-10-25 DIAGNOSIS — M50323 Other cervical disc degeneration at C6-C7 level: Secondary | ICD-10-CM | POA: Diagnosis not present

## 2018-10-25 DIAGNOSIS — M9902 Segmental and somatic dysfunction of thoracic region: Secondary | ICD-10-CM | POA: Diagnosis not present

## 2018-10-25 DIAGNOSIS — M9901 Segmental and somatic dysfunction of cervical region: Secondary | ICD-10-CM | POA: Diagnosis not present

## 2018-10-25 DIAGNOSIS — M5136 Other intervertebral disc degeneration, lumbar region: Secondary | ICD-10-CM | POA: Diagnosis not present

## 2018-10-25 DIAGNOSIS — M542 Cervicalgia: Secondary | ICD-10-CM | POA: Diagnosis not present

## 2018-10-26 DIAGNOSIS — Z0001 Encounter for general adult medical examination with abnormal findings: Secondary | ICD-10-CM | POA: Diagnosis not present

## 2018-10-26 DIAGNOSIS — E7800 Pure hypercholesterolemia, unspecified: Secondary | ICD-10-CM | POA: Diagnosis not present

## 2018-10-26 DIAGNOSIS — E119 Type 2 diabetes mellitus without complications: Secondary | ICD-10-CM | POA: Diagnosis not present

## 2018-10-26 DIAGNOSIS — Z1211 Encounter for screening for malignant neoplasm of colon: Secondary | ICD-10-CM | POA: Diagnosis not present

## 2018-10-26 DIAGNOSIS — F900 Attention-deficit hyperactivity disorder, predominantly inattentive type: Secondary | ICD-10-CM | POA: Diagnosis not present

## 2018-10-26 DIAGNOSIS — Z79899 Other long term (current) drug therapy: Secondary | ICD-10-CM | POA: Diagnosis not present

## 2018-10-26 DIAGNOSIS — I10 Essential (primary) hypertension: Secondary | ICD-10-CM | POA: Diagnosis not present

## 2018-10-26 DIAGNOSIS — Z794 Long term (current) use of insulin: Secondary | ICD-10-CM | POA: Diagnosis not present

## 2018-10-30 DIAGNOSIS — Z0001 Encounter for general adult medical examination with abnormal findings: Secondary | ICD-10-CM | POA: Diagnosis not present

## 2018-10-30 DIAGNOSIS — E7800 Pure hypercholesterolemia, unspecified: Secondary | ICD-10-CM | POA: Diagnosis not present

## 2018-10-30 DIAGNOSIS — Z125 Encounter for screening for malignant neoplasm of prostate: Secondary | ICD-10-CM | POA: Diagnosis not present

## 2018-10-30 DIAGNOSIS — E119 Type 2 diabetes mellitus without complications: Secondary | ICD-10-CM | POA: Diagnosis not present

## 2018-10-30 DIAGNOSIS — Z79899 Other long term (current) drug therapy: Secondary | ICD-10-CM | POA: Diagnosis not present

## 2018-11-01 DIAGNOSIS — M546 Pain in thoracic spine: Secondary | ICD-10-CM | POA: Diagnosis not present

## 2018-11-01 DIAGNOSIS — M545 Low back pain: Secondary | ICD-10-CM | POA: Diagnosis not present

## 2018-11-01 DIAGNOSIS — M9901 Segmental and somatic dysfunction of cervical region: Secondary | ICD-10-CM | POA: Diagnosis not present

## 2018-11-01 DIAGNOSIS — M9903 Segmental and somatic dysfunction of lumbar region: Secondary | ICD-10-CM | POA: Diagnosis not present

## 2018-11-01 DIAGNOSIS — M5137 Other intervertebral disc degeneration, lumbosacral region: Secondary | ICD-10-CM | POA: Diagnosis not present

## 2018-11-01 DIAGNOSIS — M9902 Segmental and somatic dysfunction of thoracic region: Secondary | ICD-10-CM | POA: Diagnosis not present

## 2018-11-01 DIAGNOSIS — M5136 Other intervertebral disc degeneration, lumbar region: Secondary | ICD-10-CM | POA: Diagnosis not present

## 2018-11-01 DIAGNOSIS — M542 Cervicalgia: Secondary | ICD-10-CM | POA: Diagnosis not present

## 2018-11-01 DIAGNOSIS — M50323 Other cervical disc degeneration at C6-C7 level: Secondary | ICD-10-CM | POA: Diagnosis not present

## 2018-11-26 DIAGNOSIS — M5136 Other intervertebral disc degeneration, lumbar region: Secondary | ICD-10-CM | POA: Diagnosis not present

## 2018-11-26 DIAGNOSIS — M50323 Other cervical disc degeneration at C6-C7 level: Secondary | ICD-10-CM | POA: Diagnosis not present

## 2018-11-26 DIAGNOSIS — M542 Cervicalgia: Secondary | ICD-10-CM | POA: Diagnosis not present

## 2018-11-26 DIAGNOSIS — M9901 Segmental and somatic dysfunction of cervical region: Secondary | ICD-10-CM | POA: Diagnosis not present

## 2018-11-26 DIAGNOSIS — M9903 Segmental and somatic dysfunction of lumbar region: Secondary | ICD-10-CM | POA: Diagnosis not present

## 2018-11-26 DIAGNOSIS — M545 Low back pain: Secondary | ICD-10-CM | POA: Diagnosis not present

## 2018-11-26 DIAGNOSIS — M5137 Other intervertebral disc degeneration, lumbosacral region: Secondary | ICD-10-CM | POA: Diagnosis not present

## 2018-11-26 DIAGNOSIS — M546 Pain in thoracic spine: Secondary | ICD-10-CM | POA: Diagnosis not present

## 2018-11-26 DIAGNOSIS — M9902 Segmental and somatic dysfunction of thoracic region: Secondary | ICD-10-CM | POA: Diagnosis not present

## 2018-11-29 DIAGNOSIS — K5730 Diverticulosis of large intestine without perforation or abscess without bleeding: Secondary | ICD-10-CM | POA: Diagnosis not present

## 2018-11-29 DIAGNOSIS — K640 First degree hemorrhoids: Secondary | ICD-10-CM | POA: Diagnosis not present

## 2018-11-29 DIAGNOSIS — Z86010 Personal history of colonic polyps: Secondary | ICD-10-CM | POA: Diagnosis not present

## 2018-11-29 DIAGNOSIS — D124 Benign neoplasm of descending colon: Secondary | ICD-10-CM | POA: Diagnosis not present

## 2018-12-01 DIAGNOSIS — S0502XA Injury of conjunctiva and corneal abrasion without foreign body, left eye, initial encounter: Secondary | ICD-10-CM | POA: Diagnosis not present

## 2018-12-03 DIAGNOSIS — D124 Benign neoplasm of descending colon: Secondary | ICD-10-CM | POA: Diagnosis not present

## 2018-12-03 DIAGNOSIS — H04123 Dry eye syndrome of bilateral lacrimal glands: Secondary | ICD-10-CM | POA: Diagnosis not present

## 2018-12-03 DIAGNOSIS — E119 Type 2 diabetes mellitus without complications: Secondary | ICD-10-CM | POA: Diagnosis not present

## 2018-12-03 DIAGNOSIS — Z961 Presence of intraocular lens: Secondary | ICD-10-CM | POA: Diagnosis not present

## 2018-12-03 DIAGNOSIS — I10 Essential (primary) hypertension: Secondary | ICD-10-CM | POA: Diagnosis not present

## 2018-12-10 DIAGNOSIS — M9903 Segmental and somatic dysfunction of lumbar region: Secondary | ICD-10-CM | POA: Diagnosis not present

## 2018-12-10 DIAGNOSIS — M545 Low back pain: Secondary | ICD-10-CM | POA: Diagnosis not present

## 2018-12-10 DIAGNOSIS — M9902 Segmental and somatic dysfunction of thoracic region: Secondary | ICD-10-CM | POA: Diagnosis not present

## 2018-12-10 DIAGNOSIS — M542 Cervicalgia: Secondary | ICD-10-CM | POA: Diagnosis not present

## 2018-12-10 DIAGNOSIS — M546 Pain in thoracic spine: Secondary | ICD-10-CM | POA: Diagnosis not present

## 2018-12-10 DIAGNOSIS — M5137 Other intervertebral disc degeneration, lumbosacral region: Secondary | ICD-10-CM | POA: Diagnosis not present

## 2018-12-10 DIAGNOSIS — M50323 Other cervical disc degeneration at C6-C7 level: Secondary | ICD-10-CM | POA: Diagnosis not present

## 2018-12-10 DIAGNOSIS — M9901 Segmental and somatic dysfunction of cervical region: Secondary | ICD-10-CM | POA: Diagnosis not present

## 2018-12-10 DIAGNOSIS — M5136 Other intervertebral disc degeneration, lumbar region: Secondary | ICD-10-CM | POA: Diagnosis not present

## 2018-12-25 DIAGNOSIS — M542 Cervicalgia: Secondary | ICD-10-CM | POA: Diagnosis not present

## 2018-12-25 DIAGNOSIS — M9903 Segmental and somatic dysfunction of lumbar region: Secondary | ICD-10-CM | POA: Diagnosis not present

## 2018-12-25 DIAGNOSIS — M9902 Segmental and somatic dysfunction of thoracic region: Secondary | ICD-10-CM | POA: Diagnosis not present

## 2018-12-25 DIAGNOSIS — M545 Low back pain: Secondary | ICD-10-CM | POA: Diagnosis not present

## 2018-12-25 DIAGNOSIS — M5137 Other intervertebral disc degeneration, lumbosacral region: Secondary | ICD-10-CM | POA: Diagnosis not present

## 2018-12-25 DIAGNOSIS — M546 Pain in thoracic spine: Secondary | ICD-10-CM | POA: Diagnosis not present

## 2018-12-25 DIAGNOSIS — M5136 Other intervertebral disc degeneration, lumbar region: Secondary | ICD-10-CM | POA: Diagnosis not present

## 2018-12-25 DIAGNOSIS — M9901 Segmental and somatic dysfunction of cervical region: Secondary | ICD-10-CM | POA: Diagnosis not present

## 2018-12-25 DIAGNOSIS — M50323 Other cervical disc degeneration at C6-C7 level: Secondary | ICD-10-CM | POA: Diagnosis not present

## 2018-12-31 DIAGNOSIS — R29890 Loss of height: Secondary | ICD-10-CM | POA: Diagnosis not present

## 2018-12-31 DIAGNOSIS — E1165 Type 2 diabetes mellitus with hyperglycemia: Secondary | ICD-10-CM | POA: Diagnosis not present

## 2018-12-31 DIAGNOSIS — I10 Essential (primary) hypertension: Secondary | ICD-10-CM | POA: Diagnosis not present

## 2019-01-10 DIAGNOSIS — Z23 Encounter for immunization: Secondary | ICD-10-CM | POA: Diagnosis not present

## 2019-05-21 ENCOUNTER — Ambulatory Visit (INDEPENDENT_AMBULATORY_CARE_PROVIDER_SITE_OTHER): Payer: Medicare Other

## 2019-05-21 ENCOUNTER — Other Ambulatory Visit: Payer: Self-pay

## 2019-05-21 ENCOUNTER — Ambulatory Visit (INDEPENDENT_AMBULATORY_CARE_PROVIDER_SITE_OTHER): Payer: Medicare Other | Admitting: Podiatry

## 2019-05-21 DIAGNOSIS — M9271 Juvenile osteochondrosis of metatarsus, right foot: Secondary | ICD-10-CM | POA: Diagnosis not present

## 2019-05-21 DIAGNOSIS — M778 Other enthesopathies, not elsewhere classified: Secondary | ICD-10-CM | POA: Diagnosis not present

## 2019-05-22 NOTE — Progress Notes (Signed)
Subjective:  Patient ID: Nelvin Tomb, male    DOB: 24-Dec-1950,  MRN: [id number] HPI Chief Complaint  Patient presents with  . Toe Pain    Right 2nd digit painful when standing for a long time. Pt states 69 year old injury "Dropped something on my foot". Pt states applying lidocaine 4% cream for pain and this helps him.    69 y.o. male presents with the above complaint.   ROS: Denies fever chills nausea vomiting muscle aches pains calf pain back pain chest pain shortness of breath.  Past Medical History:  Diagnosis Date  . ADHD   . Chronic lower back pain   . DKA (diabetic ketoacidoses) ([place])   . Eye infection, left    "ongoing" (08/22/2017)  . GERD (gastroesophageal reflux disease)    "q once in awhile" (08/22/2017)  . Hepatitis C antibody test positive    "but I've never had Hep C" (08/22/2017)  . Hyperglycemia    takes metformin  . Hypertension   . Type II diabetes mellitus ([place])   . Vitiligo    "my whole body" (08/22/2017)   Past Surgical History:  Procedure Laterality Date  . BRAIN SURGERY  03/2009   trigeminal neuralgia  . CHOLECYSTECTOMY    . INGUINAL HERNIA REPAIR Bilateral   . KNEE CARTILAGE SURGERY Left 1980    Current Outpatient Medications:  .  amphetamine-dextroamphetamine (ADDERALL) 10 MG tablet, Take 10 mg by mouth daily., Disp: , Rfl: 0 .  aspirin EC 81 MG tablet, Take 81 mg by mouth daily., Disp: , Rfl:  .  Bioflavonoid Products (BIOFLEX PO), Take 1 tablet by mouth daily., Disp: , Rfl:  .  Biotin 10 MG TABS, Take 1 mg by mouth daily., Disp: , Rfl:  .  blood glucose meter kit and supplies, Dispense based on patient and insurance preference. Use up to four times daily as directed. (FOR ICD-10 E10.9, E11.9)., Disp: 1 each, Rfl: 6 .  insulin glargine (LANTUS) 100 unit/mL SOPN, Inject 0.16 mLs (16 Units total) into the skin at bedtime., Disp: 15 mL, Rfl: 1 .  Insulin Pen Needle 31G X 5 MM MISC, 1 each by Does not apply route daily., Disp: 30 each, Rfl: 6 .   lisinopril (PRINIVIL,ZESTRIL) 10 MG tablet, Take 10 mg by mouth daily., Disp: , Rfl: 1 .  metFORMIN (GLUCOPHAGE) 500 MG tablet, Take 2 tablets (1,000 mg total) by mouth 2 (two) times daily., Disp: 60 tablet, Rfl: 3 .  Multiple Vitamins-Minerals (MULTI FOR HIM 50+) TABS, Take 1 tablet by mouth daily., Disp: , Rfl:  .  saw palmetto 80 MG capsule, Take 160 mg by mouth daily., Disp: , Rfl:  .  vitamin E 400 UNIT capsule, Take 400 Units by mouth daily., Disp: , Rfl:   Allergies  Allergen Reactions  . Erythromycin Nausea Only  . Vicodin [Hydrocodone-Acetaminophen] Itching   Review of Systems Objective:  There were no vitals filed for this visit.  General: Well developed, nourished, in no acute distress, alert and oriented x3   Dermatological: Skin is warm, dry and supple bilateral. Nails x 10 are well maintained; remaining integument appears unremarkable at this time. There are no open sores, no preulcerative lesions, no rash or signs of infection present.  Vascular: Dorsalis Pedis artery and Posterior Tibial artery pedal pulses are 2/4 bilateral with immedate capillary fill time. Pedal hair growth present. No varicosities and no lower extremity edema present bilateral.   Neruologic: Grossly intact via light touch bilateral. Vibratory intact via tuning  fork bilateral. Protective threshold with Semmes Wienstein monofilament intact to all pedal sites bilateral. Patellar and Achilles deep tendon reflexes 2+ bilateral. No Babinski or clonus noted bilateral.   Musculoskeletal: No gross boney pedal deformities bilateral. No pain, crepitus, or limitation noted with foot and ankle range of motion bilateral. Muscular strength 5/5 in all groups tested bilateral.  Hypertrophic bone growth around the second metatarsophalangeal joint of the right foot with good range of motion of the second toe at that joint.  There does not appear to be any crepitation or pain.  Gait: Unassisted, Nonantalgic.     Radiographs:  Radiographs were not taken today but he did have one on his phone which he demonstrated to me.  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  He also has some early osteoarthritic changes of the head of the first metatarsophalangeal joint as well.  Assessment & Plan:   Assessment: Freiberg's infraction second toe left.  Plan: Discussed etiology pathology conservative surgical therapies at this point we gave him options of injections orthotics surgery he declined all follow-up with me as needed.     Rayhan Groleau T. [place], [place]

## 2019-05-24 ENCOUNTER — Encounter: Payer: Self-pay | Admitting: Podiatry

## 2019-05-30 DIAGNOSIS — Z79899 Other long term (current) drug therapy: Secondary | ICD-10-CM | POA: Diagnosis not present

## 2019-05-30 DIAGNOSIS — Z794 Long term (current) use of insulin: Secondary | ICD-10-CM | POA: Diagnosis not present

## 2019-05-30 DIAGNOSIS — E119 Type 2 diabetes mellitus without complications: Secondary | ICD-10-CM | POA: Diagnosis not present

## 2019-06-07 ENCOUNTER — Ambulatory Visit: Payer: Medicare Other

## 2019-06-13 ENCOUNTER — Ambulatory Visit: Payer: Medicare Other | Attending: Internal Medicine

## 2019-06-13 DIAGNOSIS — Z23 Encounter for immunization: Secondary | ICD-10-CM | POA: Insufficient documentation

## 2019-06-13 NOTE — Progress Notes (Signed)
   Covid-19 Vaccination Clinic  Name:  Lonan Westenskow    MRN: [id number] DOB: [birth year]  06/13/2019  Mr. Neujahr was observed post Covid-19 immunization for 15 minutes without incidence. He was provided with Vaccine Information Sheet and instruction to access the V-Safe system.   Mr. Kilbarger was instructed to call 911 with any severe reactions post vaccine: Marland Kitchen Difficulty breathing  . Swelling of your face and throat  . A fast heartbeat  . A bad rash all over your body  . Dizziness and weakness    Immunizations Administered    Name Date Dose VIS Date Route   Pfizer COVID-19 Vaccine 06/13/2019  5:54 PM 0.3 mL 04/19/2019 Intramuscular   Manufacturer: [place]   Lot: [id number]   [place]: [id number]

## 2019-06-28 ENCOUNTER — Ambulatory Visit: Payer: Medicare Other

## 2019-07-02 DIAGNOSIS — E1165 Type 2 diabetes mellitus with hyperglycemia: Secondary | ICD-10-CM | POA: Diagnosis not present

## 2019-07-02 DIAGNOSIS — I10 Essential (primary) hypertension: Secondary | ICD-10-CM | POA: Diagnosis not present

## 2019-07-08 DIAGNOSIS — H6121 Impacted cerumen, right ear: Secondary | ICD-10-CM | POA: Diagnosis not present

## 2019-07-08 DIAGNOSIS — H6091 Unspecified otitis externa, right ear: Secondary | ICD-10-CM | POA: Diagnosis not present

## 2019-07-09 ENCOUNTER — Ambulatory Visit: Payer: Medicare Other | Attending: Internal Medicine

## 2019-07-09 DIAGNOSIS — Z23 Encounter for immunization: Secondary | ICD-10-CM | POA: Insufficient documentation

## 2019-07-09 NOTE — Progress Notes (Signed)
   Covid-19 Vaccination Clinic  Name:  Luis Long    MRN: [id number] DOB: April 27, 1951  07/09/2019  Luis Long was observed post Covid-19 immunization for 15 minutes without incident. He was provided with Vaccine Information Sheet and instruction to access the V-Safe system.   Luis Long was instructed to call 911 with any severe reactions post vaccine: Marland Kitchen Difficulty breathing  . Swelling of face and throat  . A fast heartbeat  . A bad rash all over body  . Dizziness and weakness   Immunizations Administered    Name Date Dose VIS Date Route   Pfizer COVID-19 Vaccine 07/09/2019  8:34 AM 0.3 mL 04/19/2019 Intramuscular   Manufacturer: [place]   Lot: [id number]   [place]: [id number]

## 2019-10-15 DIAGNOSIS — Z79899 Other long term (current) drug therapy: Secondary | ICD-10-CM | POA: Diagnosis not present

## 2019-10-15 DIAGNOSIS — E7800 Pure hypercholesterolemia, unspecified: Secondary | ICD-10-CM | POA: Diagnosis not present

## 2019-10-15 DIAGNOSIS — Z125 Encounter for screening for malignant neoplasm of prostate: Secondary | ICD-10-CM | POA: Diagnosis not present

## 2019-10-15 DIAGNOSIS — I10 Essential (primary) hypertension: Secondary | ICD-10-CM | POA: Diagnosis not present

## 2019-10-15 DIAGNOSIS — Z0000 Encounter for general adult medical examination without abnormal findings: Secondary | ICD-10-CM | POA: Diagnosis not present

## 2019-10-15 DIAGNOSIS — M7021 Olecranon bursitis, right elbow: Secondary | ICD-10-CM | POA: Diagnosis not present

## 2019-10-15 DIAGNOSIS — E1169 Type 2 diabetes mellitus with other specified complication: Secondary | ICD-10-CM | POA: Diagnosis not present

## 2019-11-28 DIAGNOSIS — B3784 Candidal otitis externa: Secondary | ICD-10-CM | POA: Diagnosis not present

## 2019-12-14 IMAGING — CR DG KNEE 3 VIEWS*R*
3 series · 3 of 3 positions shown · non-contrast
Comparison: None.

CLINICAL DATA: Swelling and pain for 10 days.  No trauma.

EXAM:
RIGHT KNEE - 3 VIEW

[w knee ap right]
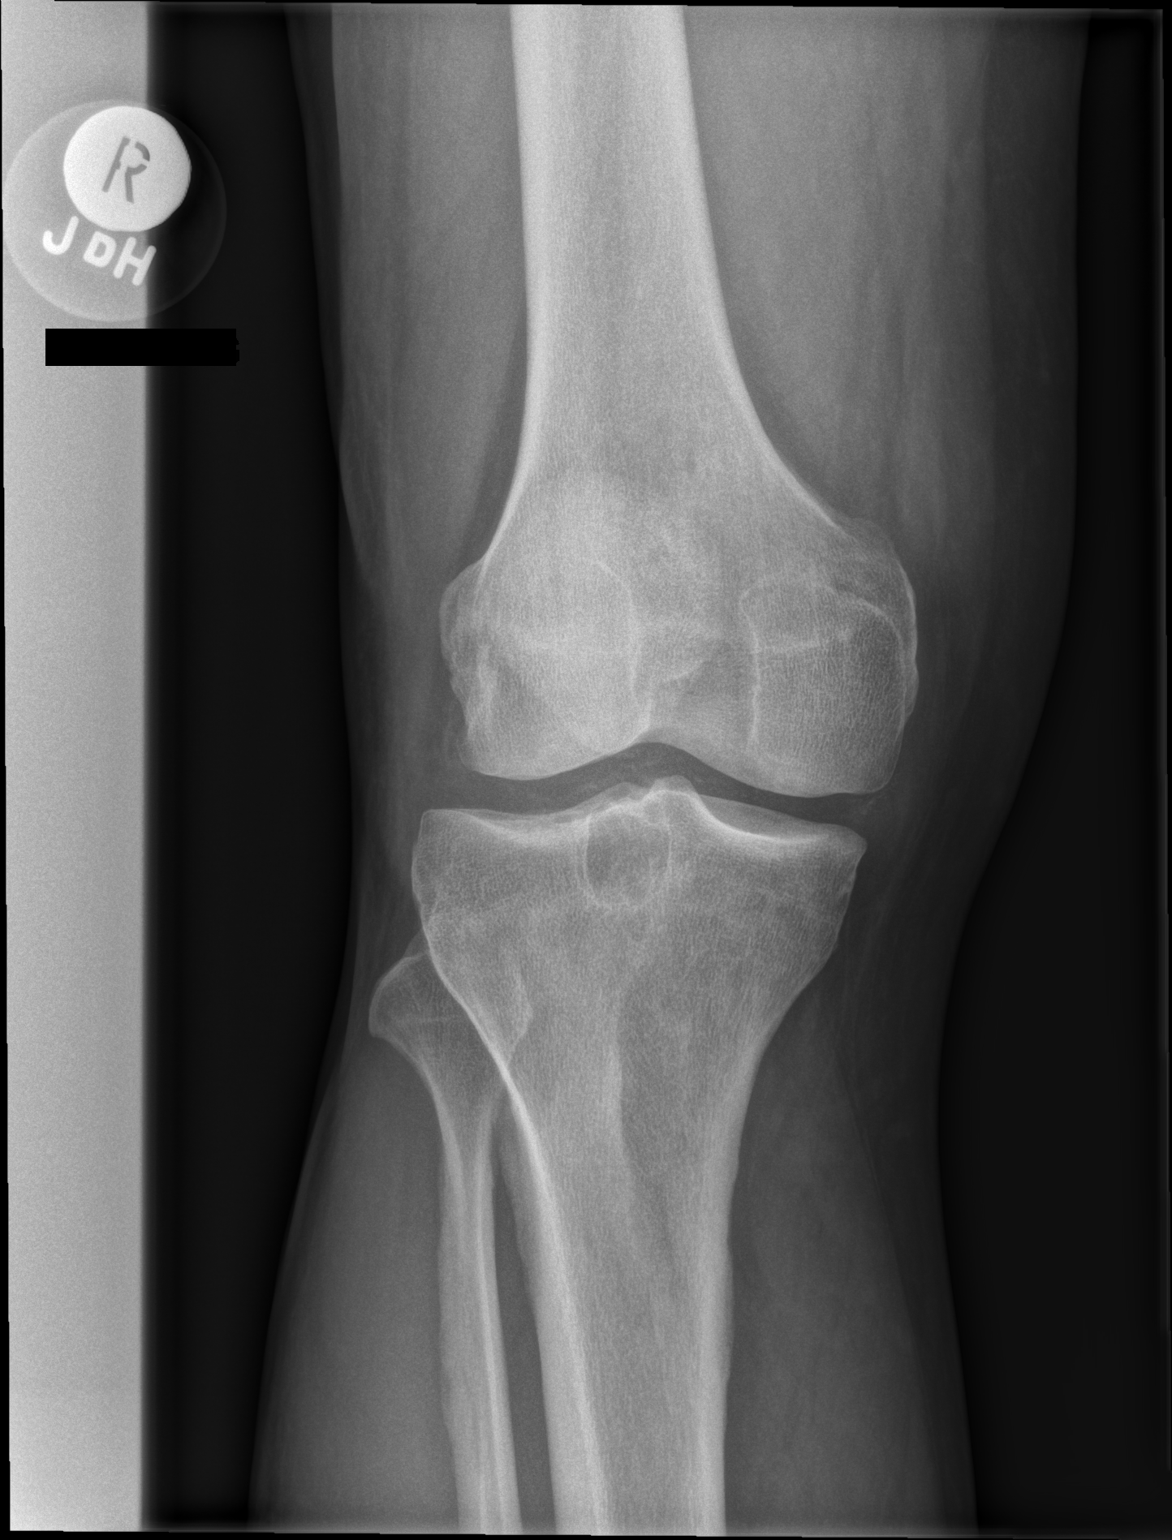

[w knee obl right]
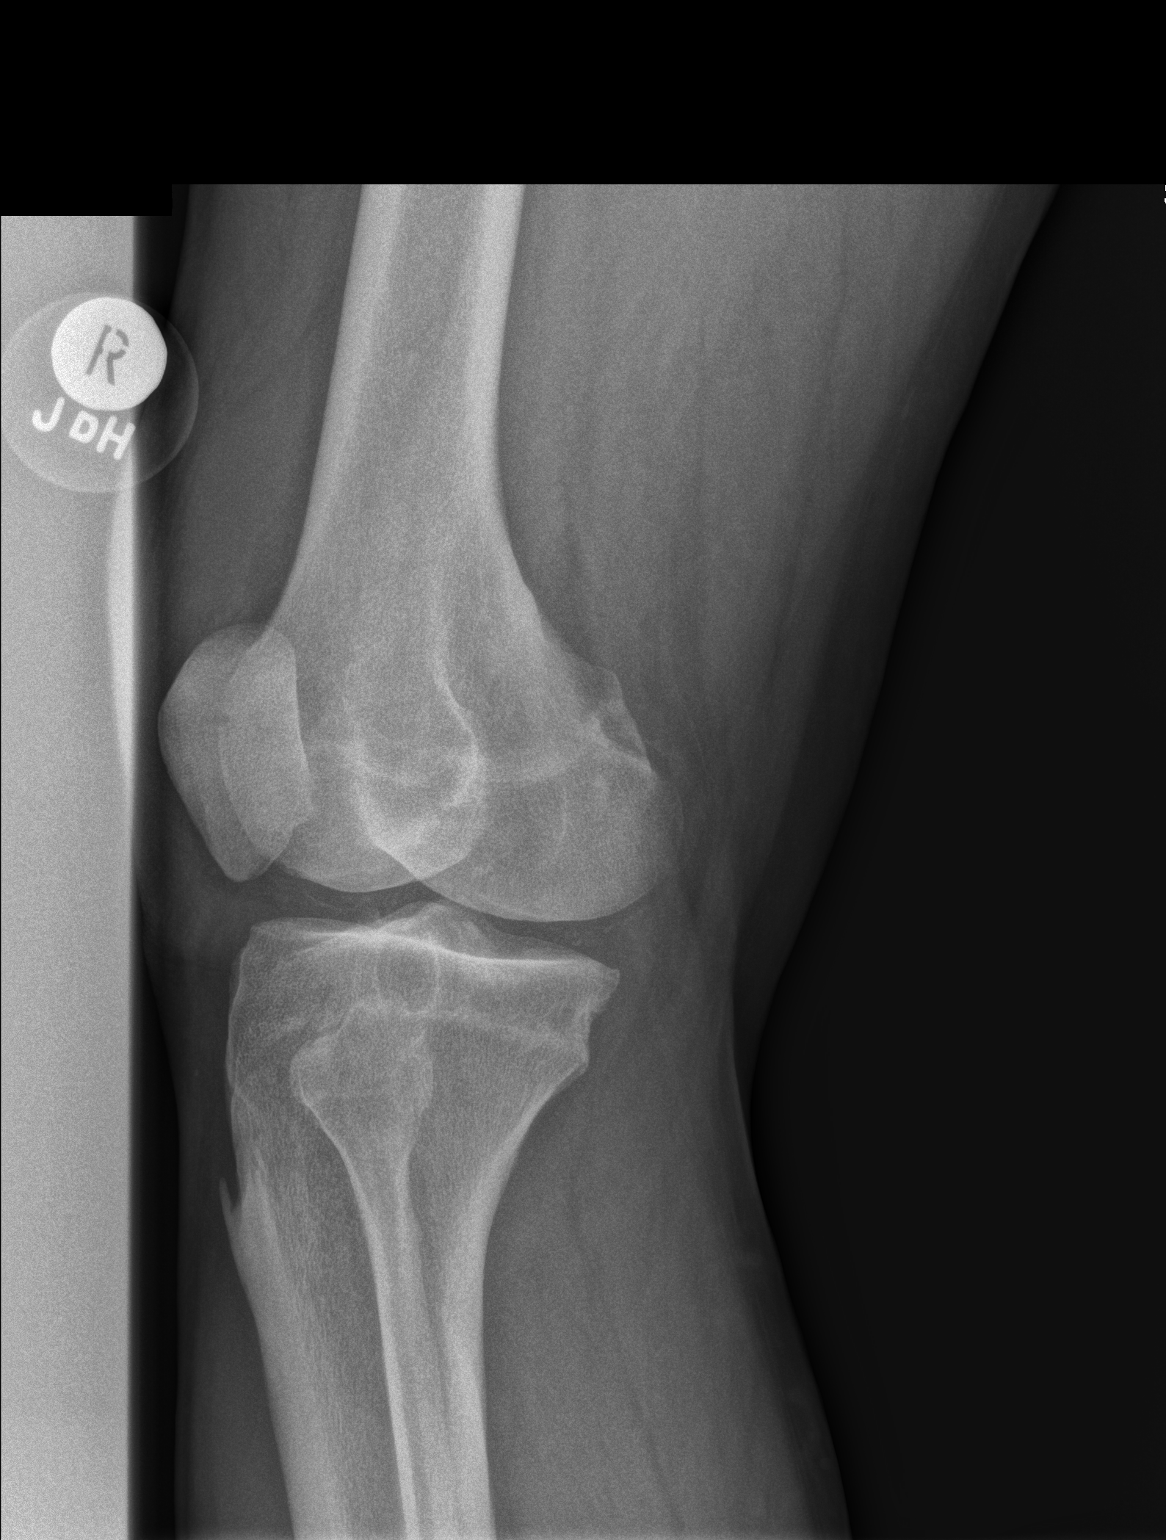

[w knee lat right]
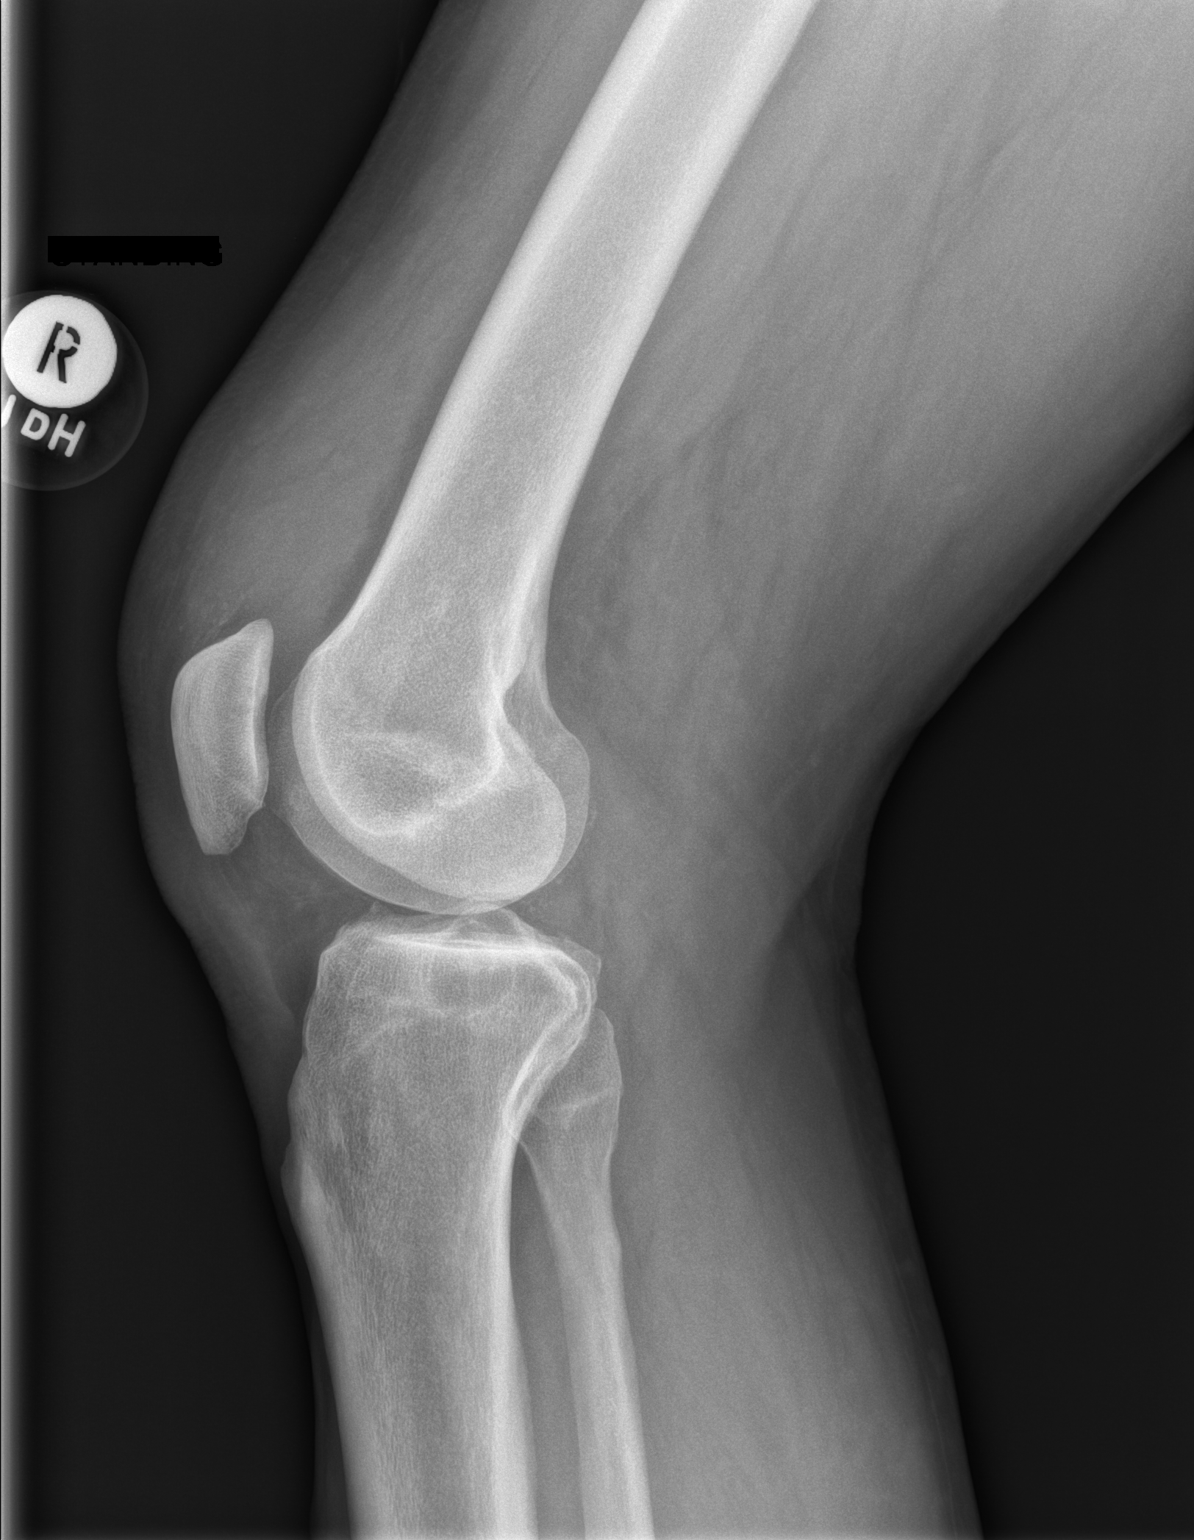

[3 of 3 positions shown; findings below may reference images not displayed]

FINDINGS: Knee compartments are well preserved in height. Faint calcifications
are seen within the joint space suggesting chondrocalcinosis due to
CPPD. No osteophytes or other signs of degenerative osteoarthritis.
No osseous erosion. No acute or suspicious osseous finding. Joint
effusion noted within the suprapatellar bursa.
IMPRESSION: 1. Faint calcifications throughout the medial and lateral knee
compartments, compatible with chondrocalcinosis suggesting
underlying CPPD.
2. Joint effusion.
3. No acute appearing osseous abnormality.

## 2019-12-30 DIAGNOSIS — I10 Essential (primary) hypertension: Secondary | ICD-10-CM | POA: Diagnosis not present

## 2019-12-30 DIAGNOSIS — E1165 Type 2 diabetes mellitus with hyperglycemia: Secondary | ICD-10-CM | POA: Diagnosis not present

## 2020-03-02 DIAGNOSIS — K602 Anal fissure, unspecified: Secondary | ICD-10-CM | POA: Diagnosis not present

## 2020-03-23 ENCOUNTER — Ambulatory Visit: Payer: Medicare Other | Attending: Internal Medicine

## 2020-03-23 DIAGNOSIS — Z23 Encounter for immunization: Secondary | ICD-10-CM

## 2020-03-23 NOTE — Progress Notes (Signed)
   Covid-19 Vaccination Clinic  Name:  Luis Long    MRN: [id number] DOB: 1950-11-08  03/23/2020  [name] was observed post Covid-19 immunization for 15 minutes without incident. He was provided with Vaccine Information Sheet and instruction to access the V-Safe system.   [name] was instructed to call 911 with any severe reactions post vaccine: Marland Kitchen Difficulty breathing  . Swelling of face and throat  . A fast heartbeat  . A bad rash all over body  . Dizziness and weakness   Immunizations Administered    Name Date Dose VIS Date Route   Pfizer COVID-19 Vaccine 03/23/2020  3:01 PM 0.3 mL 02/26/2020 Intramuscular   Manufacturer: [place]   Lot: [id number]   [place]: [id number]

## 2020-03-31 DIAGNOSIS — H9191 Unspecified hearing loss, right ear: Secondary | ICD-10-CM | POA: Diagnosis not present

## 2020-03-31 DIAGNOSIS — K602 Anal fissure, unspecified: Secondary | ICD-10-CM | POA: Diagnosis not present

## 2020-04-16 DIAGNOSIS — I10 Essential (primary) hypertension: Secondary | ICD-10-CM | POA: Diagnosis not present

## 2020-04-16 DIAGNOSIS — E7800 Pure hypercholesterolemia, unspecified: Secondary | ICD-10-CM | POA: Diagnosis not present

## 2020-04-16 DIAGNOSIS — F900 Attention-deficit hyperactivity disorder, predominantly inattentive type: Secondary | ICD-10-CM | POA: Diagnosis not present

## 2020-04-16 DIAGNOSIS — Z79899 Other long term (current) drug therapy: Secondary | ICD-10-CM | POA: Diagnosis not present

## 2020-04-22 DIAGNOSIS — H6121 Impacted cerumen, right ear: Secondary | ICD-10-CM | POA: Diagnosis not present

## 2020-04-22 DIAGNOSIS — H903 Sensorineural hearing loss, bilateral: Secondary | ICD-10-CM | POA: Diagnosis not present

## 2020-04-22 DIAGNOSIS — H838X3 Other specified diseases of inner ear, bilateral: Secondary | ICD-10-CM | POA: Diagnosis not present

## 2020-04-27 DIAGNOSIS — K602 Anal fissure, unspecified: Secondary | ICD-10-CM | POA: Diagnosis not present

## 2020-11-02 ENCOUNTER — Other Ambulatory Visit: Payer: Self-pay | Admitting: Family Medicine

## 2020-11-02 DIAGNOSIS — I77819 Aortic ectasia, unspecified site: Secondary | ICD-10-CM

## 2020-11-03 ENCOUNTER — Other Ambulatory Visit: Payer: Self-pay | Admitting: Family Medicine

## 2020-11-03 DIAGNOSIS — Z122 Encounter for screening for malignant neoplasm of respiratory organs: Secondary | ICD-10-CM

## 2020-11-03 DIAGNOSIS — F17210 Nicotine dependence, cigarettes, uncomplicated: Secondary | ICD-10-CM

## 2020-11-17 ENCOUNTER — Ambulatory Visit
Admission: RE | Admit: 2020-11-17 | Discharge: 2020-11-17 | Disposition: A | Discharge status: Home / Self Care | Payer: Medicare Other | Source: Ambulatory Visit | Attending: Family Medicine | Admitting: Family Medicine

## 2020-11-17 DIAGNOSIS — I77819 Aortic ectasia, unspecified site: Secondary | ICD-10-CM

## 2020-12-01 ENCOUNTER — Other Ambulatory Visit: Payer: Self-pay

## 2020-12-01 ENCOUNTER — Ambulatory Visit
Admission: RE | Admit: 2020-12-01 | Discharge: 2020-12-01 | Disposition: A | Discharge status: Home / Self Care | Payer: Medicare Other | Source: Ambulatory Visit | Attending: Family Medicine | Admitting: Family Medicine

## 2020-12-01 DIAGNOSIS — Z122 Encounter for screening for malignant neoplasm of respiratory organs: Secondary | ICD-10-CM

## 2020-12-01 DIAGNOSIS — F17210 Nicotine dependence, cigarettes, uncomplicated: Secondary | ICD-10-CM

## 2020-12-31 ENCOUNTER — Encounter: Payer: Self-pay | Admitting: Pulmonary Disease

## 2020-12-31 ENCOUNTER — Ambulatory Visit: Payer: Medicare Other | Admitting: Pulmonary Disease

## 2020-12-31 ENCOUNTER — Other Ambulatory Visit: Payer: Self-pay

## 2020-12-31 VITALS — BP 110/72 | HR 93 | Temp 98.6°F | Ht 68.75 in | Wt 165.8 lb

## 2020-12-31 DIAGNOSIS — J432 Centrilobular emphysema: Secondary | ICD-10-CM | POA: Diagnosis not present

## 2020-12-31 DIAGNOSIS — J849 Interstitial pulmonary disease, unspecified: Secondary | ICD-10-CM | POA: Diagnosis not present

## 2020-12-31 DIAGNOSIS — R918 Other nonspecific abnormal finding of lung field: Secondary | ICD-10-CM

## 2020-12-31 DIAGNOSIS — R5383 Other fatigue: Secondary | ICD-10-CM | POA: Diagnosis not present

## 2020-12-31 NOTE — Patient Instructions (Addendum)
The CT chest scan does show interstitial lung disease likely secondary to the history of smoking.  We will schedule you for pulmonary function tests at your follow up visit in 2 months  We will schedule you for a home sleep study for the concern of sleep apnea  Recommend working on quitting smoking or at least cutting back.  - We can consider therapy for smoking cessation which includes chantix therapy and nicotine replacement therapy with nicotine patches, gum/lozenges.

## 2020-12-31 NOTE — Progress Notes (Signed)
Synopsis: Referred in August 2022 for Interstitial lung disease by Lujean Amel, MD  Subjective:   PATIENT ID: Luis Long GENDER: male DOB: 1951/04/17, MRN: [id number]  HPI  Chief Complaint  Patient presents with   Consult    Patient reports he had a CT lung scan and was told it was abnormal and was told he may have COPD,    Alcus Bradly is a 70 year old male, former smoker with GERD, hypertension, and diabetes mellitus type II who is referred to pulmonary clinic for evaluation of ILD.  He recently underwent lung cancer screening CT chest scan by his primary care team.  The CT scan revealed multiple small pulmonary nodules scattered throughout bilaterally, with the largest measuring 5.7 mm of the right middle lobe.  There is mild diffuse bronchial wall thickening with mild centrilobular and paraseptal emphysema.  Also scattered areas of cylindrical bronchiectasis bilaterally greatest in the mid to lower lungs and some patchy peripheral predominant groundglass attenuation and mild septal thickening.  He is a daily smoker and smokes cigars or a tobacco pipe.  He has been smoking for at least 30 years.  He quit for 1 year in 2018.   He reports having a chronic cough with occasional sputum production in the morning times.  He installed an air filter in his bedroom recently which has helped reduce his nighttime cough.  He denies any overt shortness of breath, chest tightness or wheezing.  He did noticed increased exertional shortness of breath when mowing his lawn this past week when using a push lawnmower without a self-propelled feature as his personal lawnmower recently broke and he borrowed a neighbors.  He has seasonal allergies and has coughing and sneezing year-round but his symptoms are worse during the spring and fall.  He takes loratadine daily.  Denies any sinus congestion or postnasal drainage.  He has history of vitiligo.  Denies any diffuse joint aches or stiffness.  Denies  any other skin rashes.  No history of autoimmune disease in the family.  He is a retired [employer] manager.  Denies any harmful dust or chemical exposures throughout his lifetime.  Past Medical History:  Diagnosis Date   ADHD    Chronic lower back pain    DKA (diabetic ketoacidoses)    Eye infection, left    "ongoing" (08/22/2017)   GERD (gastroesophageal reflux disease)    "q once in awhile" (08/22/2017)   Hepatitis C antibody test positive    "but I've never had Hep C" (08/22/2017)   Hyperglycemia    takes metformin   Hypertension    Type II diabetes mellitus ([place])    Vitiligo    "my whole body" (08/22/2017)     No family history on file.   Social History   Socioeconomic History   Marital status: Divorced    Spouse name: Not on file   Number of children: Not on file   Years of education: Not on file   Highest education level: Not on file  Occupational History   Not on file  Tobacco Use   Smoking status: Some Days    Packs/day: 0.50    Years: 30.00    Pack years: 15.00    Types: Cigars, Cigarettes    Last attempt to quit: 03/09/2017    Years since quitting: 3.8   Smokeless tobacco: Never   Tobacco comments:    Patient has an occasional cigar. HM 12/31/20.   Vaping Use   Vaping Use: Never used  Substance  and Sexual Activity   Alcohol use: Yes    Alcohol/week: 3.0 standard drinks    Types: 3 Cans of beer per week   Drug use: Not Currently   Sexual activity: Not Currently  Other Topics Concern   Not on file  Social History Narrative   Not on file   Social Determinants of Health   Financial Resource Strain: Not on file  Food Insecurity: Not on file  Transportation Needs: Not on file  Physical Activity: Not on file  Stress: Not on file  Social Connections: Not on file  Intimate Partner Violence: Not on file     Allergies  Allergen Reactions   Erythromycin Nausea Only   Vicodin [Hydrocodone-Acetaminophen] Itching     Outpatient Medications Prior to Visit   Medication Sig Dispense Refill   amphetamine-dextroamphetamine (ADDERALL) 10 MG tablet Take 10 mg by mouth daily.  0   aspirin EC 81 MG tablet Take 81 mg by mouth daily.     Bioflavonoid Products (BIOFLEX PO) Take 1 tablet by mouth daily.     Biotin 10 MG TABS Take 1 mg by mouth daily.     blood glucose meter kit and supplies Dispense based on patient and insurance preference. Use up to four times daily as directed. (FOR ICD-10 E10.9, E11.9). 1 each 6   Continuous Blood Gluc Sensor (FREESTYLE LIBRE 14 DAY SENSOR) MISC USE AS DIRECTED EVERY 14 DAYS     insulin glargine (LANTUS) 100 unit/mL SOPN Inject 0.16 mLs (16 Units total) into the skin at bedtime. 15 mL 1   Insulin Pen Needle 31G X 5 MM MISC 1 each by Does not apply route daily. 30 each 6   JARDIANCE 10 MG TABS tablet Take 10 mg by mouth daily.     lisinopril (PRINIVIL,ZESTRIL) 10 MG tablet Take 10 mg by mouth daily.  1   metFORMIN (GLUCOPHAGE) 500 MG tablet Take 2 tablets (1,000 mg total) by mouth 2 (two) times daily. 60 tablet 3   Multiple Vitamins-Minerals (MULTI FOR HIM 50+) TABS Take 1 tablet by mouth daily.     saw palmetto 80 MG capsule Take 160 mg by mouth daily.     vitamin E 400 UNIT capsule Take 400 Units by mouth daily.     No facility-administered medications prior to visit.    Review of Systems  Constitutional:  Negative for chills, fever, malaise/fatigue and weight loss.  HENT:  Negative for congestion, sinus pain and sore throat.   Eyes: Negative.   Respiratory:  Positive for cough and sputum production. Negative for hemoptysis, shortness of breath and wheezing.   Cardiovascular:  Negative for chest pain, palpitations, orthopnea, claudication and leg swelling.  Gastrointestinal:  Negative for abdominal pain, heartburn, nausea and vomiting.  Genitourinary: Negative.   Musculoskeletal:  Negative for joint pain and myalgias.  Skin:  Negative for rash.  Neurological:  Negative for weakness.  Endo/Heme/Allergies:   Positive for environmental allergies.  Psychiatric/Behavioral: Negative.       Objective:   Vitals:   12/31/20 1109  BP: 110/72  Pulse: 93  Temp: 98.6 F (37 C)  TempSrc: Oral  SpO2: 99%  Weight: 75.2 kg  Height: 5' 8.75" (1.746 m)     Physical Exam Constitutional:      General: He is not in acute distress. HENT:     Head: Normocephalic and atraumatic.  Eyes:     Extraocular Movements: Extraocular movements intact.     Conjunctiva/sclera: Conjunctivae normal.     Pupils:  Pupils are equal, round, and reactive to light.  Cardiovascular:     Rate and Rhythm: Normal rate and regular rhythm.     Pulses: Normal pulses.     Heart sounds: Normal heart sounds. No murmur heard. Pulmonary:     Effort: Pulmonary effort is normal.     Breath sounds: No wheezing, rhonchi or rales.  Abdominal:     General: Bowel sounds are normal.     Palpations: Abdomen is soft.  Musculoskeletal:     Right lower leg: No edema.     Left lower leg: No edema.  Lymphadenopathy:     Cervical: No cervical adenopathy.  Skin:    General: Skin is warm and dry.  Neurological:     General: No focal deficit present.     Mental Status: He is alert.  Psychiatric:        Mood and Affect: Mood normal.        Behavior: Behavior normal.        Thought Content: Thought content normal.        Judgment: Judgment normal.    CBC    Component Value Date/Time   WBC 9.8 08/21/2017 1323   RBC 4.93 08/21/2017 1323   HGB 17.0 08/21/2017 1323   HCT 49.8 08/21/2017 1323   PLT 258 08/21/2017 1323   MCV 101.0 (H) 08/21/2017 1323   MCH 34.5 (H) 08/21/2017 1323   MCHC 34.1 08/21/2017 1323   RDW 12.2 08/21/2017 1323     Chest imaging: Lung Cancer Screening CT Chest 12/01/20 Lung RADS 2S. Multiple scattered pulmonary nodules bilaterally. Mild diffuse bronchial wall thickening with mild centrilobular and paraseptal emphysema. Scattered areas of cylindrical bronchiectasis in the mid to lower lungs bilaterally.  Some patchy peripheral ground glass attenuation and mild septal thickening.  PFT: No flowsheet data found.    Assessment & Plan:   Interstitial lung disease ([place]) - Plan: Pulmonary Function Test  Centrilobular emphysema ([place])  Pulmonary nodules  Discussion: Ares Cardozo is a 70 year old male, former smoker with GERD, hypertension, and diabetes mellitus type II who is referred to pulmonary clinic for evaluation of ILD.  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  He also has bronchiectasis of the middle and lower lung fields with some features of traction bronchiectasis.  His radiographic findings are likely secondary to his extensive smoking history.  He has no features for autoimmune disease at this time.  We will continue to monitor his symptoms and pulmonary function at this time.  We discussed the importance of smoking cessation extensively.  We discussed potential medical treatments including Chantix and also nicotine replacement therapy with nicotine patches and gum/lozenges.  He did express concern for sleep apnea due to family history and fatigue.  We will schedule him for home sleep study.  Follow-up in 2 months with pulmonary function tests.  Greater than 60 minutes was spent on this patient encounter which includes direct patient care, review of records, going over CT images with the patient and completing documentation/orders.  Freda Jackson, MD [facility] Office: [phone]   Current Outpatient Medications:    amphetamine-dextroamphetamine (ADDERALL) 10 MG tablet, Take 10 mg by mouth daily., Disp: , Rfl: 0   aspirin EC 81 MG tablet, Take 81 mg by mouth daily., Disp: , Rfl:    Bioflavonoid Products (BIOFLEX PO), Take 1 tablet by mouth daily., Disp: , Rfl:    Biotin 10 MG TABS,  Take 1 mg by mouth daily., Disp: , Rfl:    blood glucose meter kit and supplies, Dispense  based on patient and insurance preference. Use up to four times daily as directed. (FOR ICD-10 E10.9, E11.9)., Disp: 1 each, Rfl: 6   Continuous Blood Gluc Sensor (FREESTYLE LIBRE 14 DAY SENSOR) MISC, USE AS DIRECTED EVERY 14 DAYS, Disp: , Rfl:    insulin glargine (LANTUS) 100 unit/mL SOPN, Inject 0.16 mLs (16 Units total) into the skin at bedtime., Disp: 15 mL, Rfl: 1   Insulin Pen Needle 31G X 5 MM MISC, 1 each by Does not apply route daily., Disp: 30 each, Rfl: 6   JARDIANCE 10 MG TABS tablet, Take 10 mg by mouth daily., Disp: , Rfl:    lisinopril (PRINIVIL,ZESTRIL) 10 MG tablet, Take 10 mg by mouth daily., Disp: , Rfl: 1   metFORMIN (GLUCOPHAGE) 500 MG tablet, Take 2 tablets (1,000 mg total) by mouth 2 (two) times daily., Disp: 60 tablet, Rfl: 3   Multiple Vitamins-Minerals (MULTI FOR HIM 50+) TABS, Take 1 tablet by mouth daily., Disp: , Rfl:    saw palmetto 80 MG capsule, Take 160 mg by mouth daily., Disp: , Rfl:    vitamin E 400 UNIT capsule, Take 400 Units by mouth daily., Disp: , Rfl:

## 2021-03-09 ENCOUNTER — Encounter: Payer: Self-pay | Admitting: Pulmonary Disease

## 2021-03-09 ENCOUNTER — Ambulatory Visit (INDEPENDENT_AMBULATORY_CARE_PROVIDER_SITE_OTHER): Payer: Medicare Other | Admitting: Pulmonary Disease

## 2021-03-09 ENCOUNTER — Ambulatory Visit: Payer: Medicare Other | Admitting: Pulmonary Disease

## 2021-03-09 ENCOUNTER — Other Ambulatory Visit: Payer: Self-pay

## 2021-03-09 VITALS — BP 124/72 | HR 88 | Temp 98.5°F | Ht 68.5 in | Wt 167.0 lb

## 2021-03-09 DIAGNOSIS — J849 Interstitial pulmonary disease, unspecified: Secondary | ICD-10-CM | POA: Diagnosis not present

## 2021-03-09 DIAGNOSIS — J432 Centrilobular emphysema: Secondary | ICD-10-CM | POA: Diagnosis not present

## 2021-03-09 LAB — PULMONARY FUNCTION TEST
DL/VA % pred: 95 %
DL/VA: 3.88 ml/min/mmHg/L
DLCO cor % pred: 88 %
DLCO cor: 21.83 ml/min/mmHg
DLCO unc % pred: 88 %
DLCO unc: 21.83 ml/min/mmHg
FEF 25-75 Post: 3.45 L/sec
FEF 25-75 Pre: 3.9 L/sec
FEF2575-%Change-Post: -11 %
FEF2575-%Pred-Post: 151 %
FEF2575-%Pred-Pre: 170 %
FEV1-%Change-Post: -2 %
FEV1-%Pred-Post: 99 %
FEV1-%Pred-Pre: 102 %
FEV1-Post: 3.01 L
FEV1-Pre: 3.1 L
FEV1FVC-%Change-Post: -1 %
FEV1FVC-%Pred-Pre: 115 %
FEV6-%Change-Post: -1 %
FEV6-%Pred-Post: 92 %
FEV6-%Pred-Pre: 93 %
FEV6-Post: 3.6 L
FEV6-Pre: 3.66 L
FEV6FVC-%Pred-Post: 106 %
FEV6FVC-%Pred-Pre: 106 %
FVC-%Change-Post: -1 %
FVC-%Pred-Post: 86 %
FVC-%Pred-Pre: 88 %
FVC-Post: 3.6 L
FVC-Pre: 3.66 L
Post FEV1/FVC ratio: 84 %
Post FEV6/FVC ratio: 100 %
Pre FEV1/FVC ratio: 85 %
Pre FEV6/FVC Ratio: 100 %
RV % pred: 61 %
RV: 1.47 L
TLC % pred: 81 %
TLC: 5.5 L

## 2021-03-09 MED ORDER — SPIRIVA RESPIMAT 2.5 MCG/ACT IN AERS: 2.0000 | INHALATION_SPRAY | Freq: Every day | RESPIRATORY_TRACT | 0 refills | Status: DC

## 2021-03-09 NOTE — Progress Notes (Signed)
Synopsis: Referred in August 2022 for Interstitial lung disease by Lujean Amel, MD  Subjective:   PATIENT ID: Luis Long GENDER: male DOB: 1950-06-21, MRN: [id number]  HPI  Chief Complaint  Patient presents with   Follow-up    2-3 mo f/u after PFT.    Javid Kemler is a 70 year old male, former smoker with GERD, hypertension, and diabetes mellitus type II who returns to pulmonary clinic for ILD and emphysema.  Pulmonary function tests are within normal limits today. He continues to smoke cigars and tobacco pipe and has not cut back since initial visit. He denies any worsening in his exertional shortness of breath symptoms.   OV 12/31/20 He recently underwent lung cancer screening CT chest scan by his primary care team.  The CT scan revealed multiple small pulmonary nodules scattered throughout bilaterally, with the largest measuring 5.7 mm of the right middle lobe.  There is mild diffuse bronchial wall thickening with mild centrilobular and paraseptal emphysema.  Also scattered areas of cylindrical bronchiectasis bilaterally greatest in the mid to lower lungs and some patchy peripheral predominant groundglass attenuation and mild septal thickening.  He is a daily smoker and smokes cigars or a tobacco pipe.  He has been smoking for at least 30 years.  He quit for 1 year in 2018.   He reports having a chronic cough with occasional sputum production in the morning times.  He installed an air filter in his bedroom recently which has helped reduce his nighttime cough.  He denies any overt shortness of breath, chest tightness or wheezing.  He did noticed increased exertional shortness of breath when mowing his lawn this past week when using a push lawnmower without a self-propelled feature as his personal lawnmower recently broke and he borrowed a neighbors.  He has seasonal allergies and has coughing and sneezing year-round but his symptoms are worse during the spring and fall.  He takes  loratadine daily.  Denies any sinus congestion or postnasal drainage.  He has history of vitiligo.  Denies any diffuse joint aches or stiffness.  Denies any other skin rashes.  No history of autoimmune disease in the family.  He is a retired [employer] manager.  Denies any harmful dust or chemical exposures throughout his lifetime.  Past Medical History:  Diagnosis Date   ADHD    Chronic lower back pain    DKA (diabetic ketoacidoses)    Eye infection, left    "ongoing" (08/22/2017)   GERD (gastroesophageal reflux disease)    "q once in awhile" (08/22/2017)   Hepatitis C antibody test positive    "but I've never had Hep C" (08/22/2017)   Hyperglycemia    takes metformin   Hypertension    Type II diabetes mellitus ([place])    Vitiligo    "my whole body" (08/22/2017)     No family history on file.   Social History   Socioeconomic History   Marital status: Divorced    Spouse name: Not on file   Number of children: Not on file   Years of education: Not on file   Highest education level: Not on file  Occupational History   Not on file  Tobacco Use   Smoking status: Some Days    Packs/day: 0.50    Years: 30.00    Pack years: 15.00    Types: Cigars, Cigarettes    Last attempt to quit: 03/09/2017    Years since quitting: 4.0   Smokeless tobacco: Never   Tobacco comments:  Patient has an occasional cigar. HM 12/31/20.   Vaping Use   Vaping Use: Never used  Substance and Sexual Activity   Alcohol use: Yes    Alcohol/week: 3.0 standard drinks    Types: 3 Cans of beer per week   Drug use: Not Currently   Sexual activity: Not Currently  Other Topics Concern   Not on file  Social History Narrative   Not on file   Social Determinants of Health   Financial Resource Strain: Not on file  Food Insecurity: Not on file  Transportation Needs: Not on file  Physical Activity: Not on file  Stress: Not on file  Social Connections: Not on file  Intimate Partner Violence: Not on file      Allergies  Allergen Reactions   Erythromycin Nausea Only   Vicodin [Hydrocodone-Acetaminophen] Itching     Outpatient Medications Prior to Visit  Medication Sig Dispense Refill   amphetamine-dextroamphetamine (ADDERALL) 10 MG tablet Take 10 mg by mouth daily.  0   aspirin EC 81 MG tablet Take 81 mg by mouth daily.     Bioflavonoid Products (BIOFLEX PO) Take 1 tablet by mouth daily.     blood glucose meter kit and supplies Dispense based on patient and insurance preference. Use up to four times daily as directed. (FOR ICD-10 E10.9, E11.9). 1 each 6   Continuous Blood Gluc Sensor (FREESTYLE LIBRE 14 DAY SENSOR) MISC USE AS DIRECTED EVERY 14 DAYS     JARDIANCE 10 MG TABS tablet Take 10 mg by mouth daily.     lisinopril (PRINIVIL,ZESTRIL) 10 MG tablet Take 10 mg by mouth daily.  1   metFORMIN (GLUCOPHAGE) 500 MG tablet Take 2 tablets (1,000 mg total) by mouth 2 (two) times daily. 60 tablet 3   Multiple Vitamins-Minerals (MULTI FOR HIM 50+) TABS Take 1 tablet by mouth daily.     rosuvastatin (CRESTOR) 5 MG tablet Take 5 mg by mouth daily.     saw palmetto 80 MG capsule Take 160 mg by mouth daily.     TURMERIC PO Take by mouth.     vitamin E 400 UNIT capsule Take 400 Units by mouth daily.     Biotin 10 MG TABS Take 1 mg by mouth daily.     insulin glargine (LANTUS) 100 unit/mL SOPN Inject 0.16 mLs (16 Units total) into the skin at bedtime. 15 mL 1   Insulin Pen Needle 31G X 5 MM MISC 1 each by Does not apply route daily. 30 each 6   No facility-administered medications prior to visit.    Review of Systems  Constitutional:  Negative for chills, fever, malaise/fatigue and weight loss.  HENT:  Negative for congestion, sinus pain and sore throat.   Eyes: Negative.   Respiratory:  Positive for shortness of breath. Negative for cough, hemoptysis, sputum production and wheezing.   Cardiovascular:  Negative for chest pain, palpitations, orthopnea, claudication and leg swelling.   Gastrointestinal:  Negative for abdominal pain, heartburn, nausea and vomiting.  Genitourinary: Negative.   Musculoskeletal:  Negative for joint pain and myalgias.  Skin:  Negative for rash.  Neurological:  Negative for weakness.  Endo/Heme/Allergies:  Positive for environmental allergies.  Psychiatric/Behavioral: Negative.       Objective:   Vitals:   03/09/21 1110  BP: 124/72  Pulse: 88  Temp: 98.5 F (36.9 C)  TempSrc: Oral  SpO2: 97%  Weight: 167 lb (75.8 kg)  Height: 5' 8.5" (1.74 m)     Physical Exam Constitutional:  General: He is not in acute distress. HENT:     Head: Normocephalic and atraumatic.  Eyes:     Extraocular Movements: Extraocular movements intact.     Conjunctiva/sclera: Conjunctivae normal.     Pupils: Pupils are equal, round, and reactive to light.  Cardiovascular:     Rate and Rhythm: Normal rate and regular rhythm.     Pulses: Normal pulses.     Heart sounds: Normal heart sounds. No murmur heard. Pulmonary:     Effort: Pulmonary effort is normal.     Breath sounds: No wheezing, rhonchi or rales.  Abdominal:     General: Bowel sounds are normal.     Palpations: Abdomen is soft.  Musculoskeletal:     Right lower leg: No edema.     Left lower leg: No edema.  Skin:    General: Skin is warm and dry.  Neurological:     General: No focal deficit present.     Mental Status: He is alert.    CBC    Component Value Date/Time   WBC 9.8 08/21/2017 1323   RBC 4.93 08/21/2017 1323   HGB 17.0 08/21/2017 1323   HCT 49.8 08/21/2017 1323   PLT 258 08/21/2017 1323   MCV 101.0 (H) 08/21/2017 1323   MCH 34.5 (H) 08/21/2017 1323   MCHC 34.1 08/21/2017 1323   RDW 12.2 08/21/2017 1323   Chest imaging: Lung Cancer Screening CT Chest 12/01/20 Lung RADS 2S. Multiple scattered pulmonary nodules bilaterally. Mild diffuse bronchial wall thickening with mild centrilobular and paraseptal emphysema. Scattered areas of cylindrical bronchiectasis in the  mid to lower lungs bilaterally. Some patchy peripheral ground glass attenuation and mild septal thickening.  PFT: PFT Results Latest Ref Rng & Units 03/09/2021  FVC-Pre L 3.66  FVC-Predicted Pre % 88  FVC-Post L 3.60  FVC-Predicted Post % 86  Pre FEV1/FVC % % 85  Post FEV1/FCV % % 84  FEV1-Pre L 3.10  FEV1-Predicted Pre % 102  FEV1-Post L 3.01  DLCO uncorrected ml/min/mmHg 21.83  DLCO UNC% % 88  DLCO corrected ml/min/mmHg 21.83  DLCO COR %Predicted % 88  DLVA Predicted % 95  TLC L 5.50  TLC % Predicted % 81  RV % Predicted % 61      Assessment & Plan:   Interstitial lung disease (HCC)  Centrilobular emphysema (HCC)  Discussion: Luis Long is a 70 year old male, former smoker with GERD, hypertension, and diabetes mellitus type II who returns to pulmonary clinic for ILD and emphysema.  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  He also has bronchiectasis of the middle and lower lung fields with some features of traction bronchiectasis.  His radiographic findings are likely secondary to his extensive smoking history.  He has no features for autoimmune disease at this time.  His pulmonary function tests are within normal limits. We will continue to monitor his symptoms and pulmonary function at this time. We will check a HRCT chest in 6 months.  I stressed to him the importance of smoking cessation again at this visit.   He is scheduled for home sleep study tomorrow.  We will trial patient on spiriva respimat 2.23mg 2 puffs daily and monitor for any improvement in his dyspnea.  Follow up in 6 months.   JFreda Jackson MD [facility] Office: [phone]  Current Outpatient Medications:    amphetamine-dextroamphetamine (ADDERALL) 10 MG tablet, Take 10 mg by mouth daily.,  Disp: , Rfl: 0   aspirin EC 81 MG tablet, Take 81 mg by mouth daily., Disp: , Rfl:    Bioflavonoid  Products (BIOFLEX PO), Take 1 tablet by mouth daily., Disp: , Rfl:    blood glucose meter kit and supplies, Dispense based on patient and insurance preference. Use up to four times daily as directed. (FOR ICD-10 E10.9, E11.9)., Disp: 1 each, Rfl: 6   Continuous Blood Gluc Sensor (FREESTYLE LIBRE 14 DAY SENSOR) MISC, USE AS DIRECTED EVERY 14 DAYS, Disp: , Rfl:    JARDIANCE 10 MG TABS tablet, Take 10 mg by mouth daily., Disp: , Rfl:    lisinopril (PRINIVIL,ZESTRIL) 10 MG tablet, Take 10 mg by mouth daily., Disp: , Rfl: 1   metFORMIN (GLUCOPHAGE) 500 MG tablet, Take 2 tablets (1,000 mg total) by mouth 2 (two) times daily., Disp: 60 tablet, Rfl: 3   Multiple Vitamins-Minerals (MULTI FOR HIM 50+) TABS, Take 1 tablet by mouth daily., Disp: , Rfl:    rosuvastatin (CRESTOR) 5 MG tablet, Take 5 mg by mouth daily., Disp: , Rfl:    saw palmetto 80 MG capsule, Take 160 mg by mouth daily., Disp: , Rfl:    TURMERIC PO, Take by mouth., Disp: , Rfl:    vitamin E 400 UNIT capsule, Take 400 Units by mouth daily., Disp: , Rfl:

## 2021-03-09 NOTE — Patient Instructions (Addendum)
Start spiriva inhaler 2 puffs daily and let us know if you notice improvement in your breathing over the next month. We will then send in a prescription for you to continue on this medication.   Your pulmonary function tests are within normal range today.  We will be in touch once the sleep study results have returned.  We will check the high resolution CT chest scan in 6 months prior to your follow up appointment.

## 2021-03-09 NOTE — Patient Instructions (Signed)
Full PFT performed today. °

## 2021-03-09 NOTE — Progress Notes (Signed)
Patient seen in the office today and instructed on use of Spiriva 2.87mcg.  Patient expressed understanding and demonstrated technique.  Luis Long [facility] 03/09/21

## 2021-03-09 NOTE — Progress Notes (Signed)
Full PFT performed today. °

## 2021-03-10 ENCOUNTER — Ambulatory Visit: Payer: Medicare Other

## 2021-03-10 DIAGNOSIS — G4733 Obstructive sleep apnea (adult) (pediatric): Secondary | ICD-10-CM | POA: Diagnosis not present

## 2021-03-10 DIAGNOSIS — R5383 Other fatigue: Secondary | ICD-10-CM

## 2021-03-11 DIAGNOSIS — G4733 Obstructive sleep apnea (adult) (pediatric): Secondary | ICD-10-CM | POA: Diagnosis not present

## 2021-03-16 ENCOUNTER — Telehealth: Payer: Self-pay

## 2021-03-16 NOTE — Telephone Encounter (Signed)
ATC patient to go over results from Dr. Erin Fulling, [facility]

## 2021-03-16 NOTE — Telephone Encounter (Signed)
-----   Message from Freddi Starr, MD sent at 03/14/2021  2:09 PM EST ----- Regarding: sleep study results Please let patient know that he has mild obstructive sleep apnea. He stopped breathing or under breathed 10.9 times per hour based on his sleep study. If he would like to pursue treatment for his sleep apnea this would include CPAP or an oral appliance. We can order him a CPAP machine if he would like.   Dr. Erin Fulling   ----- Message ----- From: Tana [place] Sent: 03/12/2021   3:09 PM EST To: Freddi Starr, MD  HST results in Epic

## 2021-04-29 ENCOUNTER — Other Ambulatory Visit (HOSPITAL_COMMUNITY): Payer: Self-pay | Admitting: Family Medicine

## 2021-04-29 DIAGNOSIS — R011 Cardiac murmur, unspecified: Secondary | ICD-10-CM

## 2021-05-21 ENCOUNTER — Ambulatory Visit (HOSPITAL_COMMUNITY): Payer: Medicare Other | Attending: Cardiovascular Disease

## 2021-05-21 ENCOUNTER — Other Ambulatory Visit: Payer: Self-pay

## 2021-05-21 DIAGNOSIS — R011 Cardiac murmur, unspecified: Secondary | ICD-10-CM | POA: Diagnosis present

## 2021-05-21 LAB — ECHOCARDIOGRAM COMPLETE
Area-P 1/2: 3.99 cm2
S' Lateral: 2.9 cm

## 2021-09-24 ENCOUNTER — Inpatient Hospital Stay: Admission: RE | Admit: 2021-09-24 | Payer: Medicare Other | Source: Ambulatory Visit

## 2021-09-24 ENCOUNTER — Telehealth: Payer: Self-pay | Admitting: Pulmonary Disease

## 2021-09-24 NOTE — Telephone Encounter (Signed)
Called and spoke with patient. Patient stated that he was unaware of his CT appointment. Patient stated that he was going to reschedule his appointment for the CT to be done and that he was going to call us to schedule a f/u appointment after he gets it scheduled.   Nothing further needed.

## 2021-10-13 ENCOUNTER — Telehealth: Payer: Self-pay | Admitting: Pulmonary Disease

## 2021-10-13 ENCOUNTER — Ambulatory Visit (INDEPENDENT_AMBULATORY_CARE_PROVIDER_SITE_OTHER)
Admission: RE | Admit: 2021-10-13 | Discharge: 2021-10-13 | Disposition: A | Discharge status: Home / Self Care | Payer: Medicare Other | Source: Ambulatory Visit | Attending: Pulmonary Disease | Admitting: Pulmonary Disease

## 2021-10-13 DIAGNOSIS — J849 Interstitial pulmonary disease, unspecified: Secondary | ICD-10-CM | POA: Diagnosis not present

## 2021-10-13 NOTE — Telephone Encounter (Signed)
Called patient and got him scheduled to see Dr Erin Fulling in office on 6/21 to go over CT scan results that was done today 6/7. Nothing further needed at this tim e

## 2021-10-27 ENCOUNTER — Ambulatory Visit: Payer: Medicare Other | Admitting: Pulmonary Disease

## 2021-10-27 ENCOUNTER — Encounter: Payer: Self-pay | Admitting: Pulmonary Disease

## 2021-10-27 VITALS — BP 112/68 | HR 92 | Ht 68.5 in | Wt 161.0 lb

## 2021-10-27 DIAGNOSIS — J849 Interstitial pulmonary disease, unspecified: Secondary | ICD-10-CM

## 2021-10-27 DIAGNOSIS — J432 Centrilobular emphysema: Secondary | ICD-10-CM

## 2021-10-27 NOTE — Progress Notes (Signed)
Synopsis: Referred in August 2022 for Interstitial lung disease by Darrow Bussing, MD  Subjective:   PATIENT ID: Luis Long GENDER: male DOB: 06/18/1950, MRN: [id number]  HPI  Chief Complaint  Patient presents with   Follow-up    F/U after CT scan    Luis Long is a 71 year old male, daily smoker with GERD, hypertension, and diabetes mellitus type II who returns to pulmonary clinic for ILD and emphysema.  He injured his right knee recently and tore his meniscus. He reports no changes in his breathing. He continues to smoke.   HRCT Chest 10/13/21 shows stable mild pulmonary fibrosis pattern when compared to 11/2020 scan.  OV 03/09/21 Pulmonary function tests are within normal limits today. He continues to smoke cigars and tobacco pipe and has not cut back since initial visit. He denies any worsening in his exertional shortness of breath symptoms.   OV 12/31/20 He recently underwent lung cancer screening CT chest scan by his primary care team.  The CT scan revealed multiple small pulmonary nodules scattered throughout bilaterally, with the largest measuring 5.7 mm of the right middle lobe.  There is mild diffuse bronchial wall thickening with mild centrilobular and paraseptal emphysema.  Also scattered areas of cylindrical bronchiectasis bilaterally greatest in the mid to lower lungs and some patchy peripheral predominant groundglass attenuation and mild septal thickening.  He is a daily smoker and smokes cigars or a tobacco pipe.  He has been smoking for at least 30 years.  He quit for 1 year in 2018.   He reports having a chronic cough with occasional sputum production in the morning times.  He installed an air filter in his bedroom recently which has helped reduce his nighttime cough.  He denies any overt shortness of breath, chest tightness or wheezing.  He did noticed increased exertional shortness of breath when mowing his lawn this past week when using a push lawnmower without a  self-propelled feature as his personal lawnmower recently broke and he borrowed a neighbors.  He has seasonal allergies and has coughing and sneezing year-round but his symptoms are worse during the spring and fall.  He takes loratadine daily.  Denies any sinus congestion or postnasal drainage.  He has history of vitiligo.  Denies any diffuse joint aches or stiffness.  Denies any other skin rashes.  No history of autoimmune disease in the family.  He is a retired Education officer, environmental.  Denies any harmful dust or chemical exposures throughout his lifetime.  Past Medical History:  Diagnosis Date   ADHD    Chronic lower back pain    DKA (diabetic ketoacidoses)    Eye infection, left    "ongoing" (08/22/2017)   GERD (gastroesophageal reflux disease)    "q once in awhile" (08/22/2017)   Hepatitis C antibody test positive    "but I've never had Hep C" (08/22/2017)   Hyperglycemia    takes metformin   Hypertension    Type II diabetes mellitus (HCC)    Vitiligo    "my whole body" (08/22/2017)     No family history on file.   Social History   Socioeconomic History   Marital status: Single    Spouse name: Not on file   Number of children: Not on file   Years of education: Not on file   Highest education level: Not on file  Occupational History   Not on file  Tobacco Use   Smoking status: Some Days    Packs/day: 0.50  Years: 30.00    Total pack years: 15.00    Types: Cigars, Cigarettes    Last attempt to quit: 03/09/2017    Years since quitting: 4.6   Smokeless tobacco: Never   Tobacco comments:    Patient has an occasional cigar. HM 12/31/20.   Vaping Use   Vaping Use: Never used  Substance and Sexual Activity   Alcohol use: Yes    Alcohol/week: 3.0 standard drinks of alcohol    Types: 3 Cans of beer per week   Drug use: Not Currently   Sexual activity: Not Currently  Other Topics Concern   Not on file  Social History Narrative   Not on file   Social Determinants of Health    Financial Resource Strain: Not on file  Food Insecurity: Not on file  Transportation Needs: Not on file  Physical Activity: Not on file  Stress: Not on file  Social Connections: Not on file  Intimate Partner Violence: Not on file     Allergies  Allergen Reactions   Erythromycin Nausea Only   Vicodin [Hydrocodone-Acetaminophen] Itching     Outpatient Medications Prior to Visit  Medication Sig Dispense Refill   amphetamine-dextroamphetamine (ADDERALL) 10 MG tablet Take 10 mg by mouth daily.  0   aspirin EC 81 MG tablet Take 81 mg by mouth daily.     blood glucose meter kit and supplies Dispense based on patient and insurance preference. Use up to four times daily as directed. (FOR ICD-10 E10.9, E11.9). 1 each 6   JARDIANCE 10 MG TABS tablet Take 10 mg by mouth daily.     lisinopril (PRINIVIL,ZESTRIL) 10 MG tablet Take 10 mg by mouth daily.  1   metFORMIN (GLUCOPHAGE) 500 MG tablet Take 2 tablets (1,000 mg total) by mouth 2 (two) times daily. 60 tablet 3   Multiple Vitamins-Minerals (MULTI FOR HIM 50+) TABS Take 1 tablet by mouth daily.     Omega-3 Fatty Acids (FISH OIL) 1000 MG CAPS Take 1,000 mg by mouth daily.     rosuvastatin (CRESTOR) 5 MG tablet Take 5 mg by mouth daily.     TURMERIC PO Take by mouth.     vitamin E 400 UNIT capsule Take 400 Units by mouth daily.     Tiotropium Bromide Monohydrate (SPIRIVA RESPIMAT) 2.5 MCG/ACT AERS Inhale 2 puffs into the lungs daily. 8 g 0   Bioflavonoid Products (BIOFLEX PO) Take 1 tablet by mouth daily.     Continuous Blood Gluc Sensor (FREESTYLE LIBRE 14 DAY SENSOR) MISC USE AS DIRECTED EVERY 14 DAYS     saw palmetto 80 MG capsule Take 160 mg by mouth daily.     No facility-administered medications prior to visit.   Review of Systems  Constitutional:  Negative for chills, fever, malaise/fatigue and weight loss.  HENT:  Negative for congestion, sinus pain and sore throat.   Eyes: Negative.   Respiratory:  Positive for shortness of  breath. Negative for cough, hemoptysis, sputum production and wheezing.   Cardiovascular:  Negative for chest pain, palpitations, orthopnea, claudication and leg swelling.  Gastrointestinal:  Negative for abdominal pain, heartburn, nausea and vomiting.  Genitourinary: Negative.   Musculoskeletal:  Negative for joint pain and myalgias.  Skin:  Negative for rash.  Neurological:  Negative for weakness.  Endo/Heme/Allergies:  Positive for environmental allergies.  Psychiatric/Behavioral: Negative.     Objective:   Vitals:   10/27/21 1521  BP: 112/68  Pulse: 92  SpO2: 96%  Weight: 161 lb (73 kg)  Height: 5' 8.5" (1.74 m)    Physical Exam Constitutional:      General: He is not in acute distress. HENT:     Head: Normocephalic and atraumatic.  Eyes:     Extraocular Movements: Extraocular movements intact.     Conjunctiva/sclera: Conjunctivae normal.     Pupils: Pupils are equal, round, and reactive to light.  Cardiovascular:     Rate and Rhythm: Normal rate and regular rhythm.     Pulses: Normal pulses.     Heart sounds: Normal heart sounds. No murmur heard. Pulmonary:     Effort: Pulmonary effort is normal.     Breath sounds: No wheezing, rhonchi or rales.  Abdominal:     General: Bowel sounds are normal.     Palpations: Abdomen is soft.  Musculoskeletal:     Right lower leg: No edema.     Left lower leg: No edema.  Skin:    General: Skin is warm and dry.  Neurological:     General: No focal deficit present.     Mental Status: He is alert.    CBC    Component Value Date/Time   WBC 9.8 08/21/2017 1323   RBC 4.93 08/21/2017 1323   HGB 17.0 08/21/2017 1323   HCT 49.8 08/21/2017 1323   PLT 258 08/21/2017 1323   MCV 101.0 (H) 08/21/2017 1323   MCH 34.5 (H) 08/21/2017 1323   MCHC 34.1 08/21/2017 1323   RDW 12.2 08/21/2017 1323   Chest imaging: HRCT Chest 10/13/21 1. Unchanged mild pulmonary fibrosis in a pattern with slight apical to basal gradient featuring  irregular peripheral interstitial opacity, septal thickening, and traction bronchiectasis at the lung basis without clear evidence of subpleural bronchiolectasis or honeycombing. 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 Am Rosezetta Schlatter Crit Care Med Vol 198, Iss 5, [phone], Jan 07 2017. 2. Emphysema. 3. Coronary artery disease. 4. Hepatic steatosis.  Lung Cancer Screening CT Chest 12/01/20 Lung RADS 2S. Multiple scattered pulmonary nodules bilaterally. Mild diffuse bronchial wall thickening with mild centrilobular and paraseptal emphysema. Scattered areas of cylindrical bronchiectasis in the mid to lower lungs bilaterally. Some patchy peripheral ground glass attenuation and mild septal thickening.  PFT:    Latest Ref Rng & Units 03/09/2021   10:03 AM  PFT Results  FVC-Pre L 3.66   FVC-Predicted Pre % 88   FVC-Post L 3.60   FVC-Predicted Post % 86   Pre FEV1/FVC % % 85   Post FEV1/FCV % % 84   FEV1-Pre L 3.10   FEV1-Predicted Pre % 102   FEV1-Post L 3.01   DLCO uncorrected ml/min/mmHg 21.83   DLCO UNC% % 88   DLCO corrected ml/min/mmHg 21.83   DLCO COR %Predicted % 88   DLVA Predicted % 95   TLC L 5.50   TLC % Predicted % 81   RV % Predicted % 61       Assessment & Plan:   Interstitial lung disease (HCC) - Plan: Pulmonary Function Test  Centrilobular emphysema (HCC) - Plan: Pulmonary Function Test  Discussion: Abass Magan is a 71 year old male, daily smoker with GERD, hypertension, and diabetes mellitus type II who returns to pulmonary clinic for ILD and emphysema.  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  He also has bronchiectasis of the middle and lower lung fields with some features of traction bronchiectasis.  His radiographic findings are likely secondary to his  extensive smoking history.  He has no features for autoimmune disease at this time.  His pulmonary function tests are within normal limits.   We will continue to monitor his symptoms and pulmonary function at this time.   Chest imaging shows stable fibrosis pattern over the past year.   I stressed to him the importance of smoking cessation again at this visit.   Follow up in 1 year.  Melody Comas, MD [facility] Office: [phone]   Current Outpatient Medications:    amphetamine-dextroamphetamine (ADDERALL) 10 MG tablet, Take 10 mg by mouth daily., Disp: , Rfl: 0   aspirin EC 81 MG tablet, Take 81 mg by mouth daily., Disp: , Rfl:    blood glucose meter kit and supplies, Dispense based on patient and insurance preference. Use up to four times daily as directed. (FOR ICD-10 E10.9, E11.9)., Disp: 1 each, Rfl: 6   JARDIANCE 10 MG TABS tablet, Take 10 mg by mouth daily., Disp: , Rfl:    lisinopril (PRINIVIL,ZESTRIL) 10 MG tablet, Take 10 mg by mouth daily., Disp: , Rfl: 1   metFORMIN (GLUCOPHAGE) 500 MG tablet, Take 2 tablets (1,000 mg total) by mouth 2 (two) times daily., Disp: 60 tablet, Rfl: 3   Multiple Vitamins-Minerals (MULTI FOR HIM 50+) TABS, Take 1 tablet by mouth daily., Disp: , Rfl:    Omega-3 Fatty Acids (FISH OIL) 1000 MG CAPS, Take 1,000 mg by mouth daily., Disp: , Rfl:    rosuvastatin (CRESTOR) 5 MG tablet, Take 5 mg by mouth daily., Disp: , Rfl:    TURMERIC PO, Take by mouth., Disp: , Rfl:    vitamin E 400 UNIT capsule, Take 400 Units by mouth daily., Disp: , Rfl:

## 2021-10-27 NOTE — Patient Instructions (Signed)
Your CT chest scan is stable from last year. There are findings of mild emphysema and mild pulmonary fibrosis.   We will continue to monitor these findings at this time with breathing tests and CT Chest scans in the future.   Follow up in 1 year with pulmonary function tests

## 2021-10-30 ENCOUNTER — Encounter: Payer: Self-pay | Admitting: Pulmonary Disease

## 2023-01-05 ENCOUNTER — Inpatient Hospital Stay: Payer: Medicare Other | Attending: Hematology and Oncology | Admitting: Hematology and Oncology

## 2023-01-05 ENCOUNTER — Inpatient Hospital Stay: Payer: Medicare Other

## 2023-01-05 VITALS — BP 129/67 | HR 90 | Temp 98.1°F | Resp 16 | Wt 167.6 lb

## 2023-01-05 DIAGNOSIS — E1159 Type 2 diabetes mellitus with other circulatory complications: Secondary | ICD-10-CM

## 2023-01-05 DIAGNOSIS — D7589 Other specified diseases of blood and blood-forming organs: Secondary | ICD-10-CM

## 2023-01-05 DIAGNOSIS — Z800 Family history of malignant neoplasm of digestive organs: Secondary | ICD-10-CM | POA: Diagnosis not present

## 2023-01-05 DIAGNOSIS — Z803 Family history of malignant neoplasm of breast: Secondary | ICD-10-CM

## 2023-01-05 DIAGNOSIS — Z809 Family history of malignant neoplasm, unspecified: Secondary | ICD-10-CM

## 2023-01-05 DIAGNOSIS — L80 Vitiligo: Secondary | ICD-10-CM | POA: Diagnosis not present

## 2023-01-05 DIAGNOSIS — Z8042 Family history of malignant neoplasm of prostate: Secondary | ICD-10-CM

## 2023-01-05 DIAGNOSIS — G4733 Obstructive sleep apnea (adult) (pediatric): Secondary | ICD-10-CM

## 2023-01-05 DIAGNOSIS — K219 Gastro-esophageal reflux disease without esophagitis: Secondary | ICD-10-CM | POA: Diagnosis not present

## 2023-01-05 DIAGNOSIS — I10 Essential (primary) hypertension: Secondary | ICD-10-CM

## 2023-01-05 DIAGNOSIS — F17210 Nicotine dependence, cigarettes, uncomplicated: Secondary | ICD-10-CM

## 2023-01-05 DIAGNOSIS — Z79899 Other long term (current) drug therapy: Secondary | ICD-10-CM | POA: Insufficient documentation

## 2023-01-05 DIAGNOSIS — D751 Secondary polycythemia: Secondary | ICD-10-CM | POA: Diagnosis present

## 2023-01-05 LAB — CMP (CANCER CENTER ONLY)
ALT: 22 U/L (ref 0–44)
AST: 20 U/L (ref 15–41)
Albumin: 4.3 g/dL (ref 3.5–5.0)
Alkaline Phosphatase: 64 U/L (ref 38–126)
Anion gap: 6 (ref 5–15)
BUN: 23 mg/dL (ref 8–23)
CO2: 28 mmol/L (ref 22–32)
Calcium: 9.5 mg/dL (ref 8.9–10.3)
Chloride: 105 mmol/L (ref 98–111)
Creatinine: 0.99 mg/dL (ref 0.61–1.24)
GFR, Estimated: >60 mL/min (ref >60)
Glucose, Bld: 140 mg/dL — ABNORMAL HIGH (ref 70–99)
Potassium: 4.3 mmol/L (ref 3.5–5.1)
Sodium: 139 mmol/L (ref 135–145)
Total Bilirubin: 0.4 mg/dL (ref 0.3–1.2)
Total Protein: 7.1 g/dL (ref 6.5–8.1)

## 2023-01-05 LAB — CBC WITH DIFFERENTIAL (CANCER CENTER ONLY)
Abs Immature Granulocytes: 0.06 10*3/uL (ref 0.00–0.07)
Basophils Absolute: 0 10*3/uL (ref 0.0–0.1)
Basophils Relative: 0 %
Eosinophils Absolute: 0 10*3/uL (ref 0.0–0.5)
Eosinophils Relative: 0 %
HCT: 49.8 % (ref 39.0–52.0)
Hemoglobin: 17.1 g/dL — ABNORMAL HIGH (ref 13.0–17.0)
Immature Granulocytes: 1 %
Lymphocytes Relative: 20 %
Lymphs Abs: 1.9 10*3/uL (ref 0.7–4.0)
MCH: 35.5 pg — ABNORMAL HIGH (ref 26.0–34.0)
MCHC: 34.3 g/dL (ref 30.0–36.0)
MCV: 103.3 fL — ABNORMAL HIGH (ref 80.0–100.0)
Monocytes Absolute: 0.9 10*3/uL (ref 0.1–1.0)
Monocytes Relative: 10 %
Neutro Abs: 6.7 10*3/uL (ref 1.7–7.7)
Neutrophils Relative %: 69 %
Platelet Count: 209 10*3/uL (ref 150–400)
RBC: 4.82 MIL/uL (ref 4.22–5.81)
RDW: 13 % (ref 11.5–15.5)
WBC Count: 9.6 10*3/uL (ref 4.0–10.5)
nRBC: 0 % (ref 0.0–0.2)

## 2023-01-05 NOTE — Progress Notes (Signed)
[facility] Telephone:(336) [phone]   [phone]  INITIAL CONSULT NOTE  Patient Care Team: Darrow Bussing, MD as PCP - General (Family Medicine)  Hematological/Oncological History # Polycythemia 08/21/2017: WBC 9.8, Hgb 17.0, MCV 101, Plt 258 11/25/2022: White blood cell 11.4, hemoglobin 17.8, MCV 105, and platelets of 247 01/05/2023: establish care with Dr. Leonides Schanz  CHIEF COMPLAINTS/PURPOSE OF CONSULTATION:  "Polycythemia "  HISTORY OF PRESENTING ILLNESS:  Luis Long 72 y.o. male with medical history significant for GERD, type 2 diabetes, vitiligo, and hypertension who presents for evaluation of polycythemia.  On review of the previous records Mr. Riedman had labs drawn on 08/21/2017 which showed WBC 9.8, Hgb 17.0, MCV 101, Plt 258.  Most recently on 11/25/2022 patient had labs collected which showed White blood cell 11.4, hemoglobin 17.8, MCV 105, and platelets of 247.  Due to concern for his polycythemia he was referred to hematology for further evaluation and management.  On exam today Mr. Vanduzer reports that he is a smoker having smoked for 30 years.  He reports he generally smokes 1 pack in a 3-day period.  He reports that he also has type 2 diabetes and did have a terrible episode of DKA where he was hospitalized for 4 days.  He reports that he did have sleep study and OSA testing in the past in November 2022.  He reports that he does not currently use a CPAP machine.  He also has tried multiple times to quit smoking but has difficulty with it.  On further discussion he reports a strong family history of cancer.  His father had stomach cancer and prostate cancer.  His brother had prostate cancer.  His paternal aunt had breast cancer and had a cousin with breast cancer.  His paternal grandmother had cancer of unclear kind and all 4 of her sisters had cancer as well.  He reports his mother side of the family does not carry much in the way of cancer.  He notes that he  does not like to drink alcohol, approximately 1-3 beers per night.  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  He currently denies any fevers, chills, sweats, nausea, vomiting or diarrhea.  He has never had a blood clot.  Full 10 point ROS is otherwise negative.  MEDICAL HISTORY:  Past Medical History:  Diagnosis Date   ADHD    Chronic lower back pain    DKA (diabetic ketoacidoses)    Eye infection, left    "ongoing" (08/22/2017)   GERD (gastroesophageal reflux disease)    "q once in awhile" (08/22/2017)   Hepatitis C antibody test positive    "but I've never had Hep C" (08/22/2017)   Hyperglycemia    takes metformin   Hypertension    Type II diabetes mellitus (HCC)    Vitiligo    "my whole body" (08/22/2017)    SURGICAL HISTORY: Past Surgical History:  Procedure Laterality Date   BRAIN SURGERY  03/2009   trigeminal neuralgia   CHOLECYSTECTOMY     INGUINAL HERNIA REPAIR Bilateral    KNEE CARTILAGE SURGERY Left 1980    SOCIAL HISTORY: Social History   Socioeconomic History   Marital status: Single    Spouse name: Not on file   Number of children: Not on file   Years of education: Not on file   Highest education level: Not on file  Occupational History   Not on file  Tobacco Use  Smoking status: Some Days    Current packs/day: 0.00    Average packs/day: 0.5 packs/day for 30.0 years (15.0 ttl pk-yrs)    Types: Cigars, Cigarettes    Start date: 03/10/1987    Last attempt to quit: 03/09/2017    Years since quitting: 5.8   Smokeless tobacco: Never   Tobacco comments:    Patient has an occasional cigar. HM 12/31/20.   Vaping Use   Vaping status: Never Used  Substance and Sexual Activity   Alcohol use: Yes    Alcohol/week: 3.0 standard drinks of alcohol    Types: 3 Cans of beer per week   Drug use: Not Currently   Sexual activity: Not Currently  Other Topics Concern   Not on file  Social History Narrative   Not on  file   Social Determinants of Health   Financial Resource Strain: Medium Risk (03/24/2021)   Received from [facility], [facility]   Overall Financial Resource Strain (CARDIA)    Difficulty of Paying Living Expenses: Somewhat hard  Food Insecurity: No Food Insecurity (05/19/2021)   Received from [facility], [facility]   Hunger Vital Sign    Worried About Running Out of Food in the Last Year: Never true    Ran Out of Food in the Last Year: Never true  Transportation Needs: No Transportation Needs (03/24/2021)   Received from [facility], [facility]   PRAPARE - Transportation    Lack of Transportation (Medical): No    Lack of Transportation (Non-Medical): No  Physical Activity: Insufficiently Active (03/24/2021)   Received from [facility], [facility]   Exercise Vital Sign    Days of Exercise per Week: 2 days    Minutes of Exercise per Session: 30 min  Stress: No Stress Concern Present (03/24/2021)   Received from [facility], [facility] - Occupational Stress Questionnaire    Feeling of Stress : Only a little  Social Connections: Unknown (09/21/2021)   Received from [facility], [facility]   Social Network    Social Network: Not on file  Intimate Partner Violence: Unknown (08/13/2021)   Received from [facility], [facility]   HITS    Physically Hurt: Not on file    Insult or Talk Down To: Not on file    Threaten Physical Harm: Not on file    Scream or Curse: Not on file    FAMILY HISTORY: No family history on file.  ALLERGIES:  is allergic to erythromycin and vicodin [hydrocodone-acetaminophen].  MEDICATIONS:  Current Outpatient Medications  Medication Sig Dispense Refill   varenicline (CHANTIX) 1 MG tablet Take by mouth.     amphetamine-dextroamphetamine (ADDERALL) 10 MG tablet Take 10 mg by mouth daily.  0   aspirin EC 81 MG tablet Take 81 mg by mouth daily.     blood glucose meter kit and  supplies Dispense based on patient and insurance preference. Use up to four times daily as directed. (FOR ICD-10 E10.9, E11.9). 1 each 6   JARDIANCE 10 MG TABS tablet Take 10 mg by mouth daily.     lisinopril (PRINIVIL,ZESTRIL) 10 MG tablet Take 10 mg by mouth daily.  1   MAGNESIUM PO Take 1 tablet by mouth daily.     metFORMIN (GLUCOPHAGE) 500 MG tablet Take 2 tablets (1,000 mg total) by mouth 2 (two) times daily. 60 tablet 3   Multiple Vitamins-Minerals (MULTI FOR HIM 50+) TABS Take 1 tablet by mouth daily.  Omega-3 Fatty Acids (FISH OIL) 1000 MG CAPS Take 1,000 mg by mouth daily.     rosuvastatin (CRESTOR) 5 MG tablet Take 5 mg by mouth daily.     TURMERIC PO Take by mouth.     No current facility-administered medications for this visit.    REVIEW OF SYSTEMS:   Constitutional: ( - ) fevers, ( - )  chills , ( - ) night sweats Eyes: ( - ) blurriness of vision, ( - ) double vision, ( - ) watery eyes Ears, nose, mouth, throat, and face: ( - ) mucositis, ( - ) sore throat Respiratory: ( - ) cough, ( - ) dyspnea, ( - ) wheezes Cardiovascular: ( - ) palpitation, ( - ) chest discomfort, ( - ) lower extremity swelling Gastrointestinal:  ( - ) nausea, ( - ) heartburn, ( - ) change in bowel habits Skin: ( - ) abnormal skin rashes Lymphatics: ( - ) new lymphadenopathy, ( - ) easy bruising Neurological: ( - ) numbness, ( - ) tingling, ( - ) new weaknesses Behavioral/Psych: ( - ) mood change, ( - ) new changes  All other systems were reviewed with the patient and are negative.  PHYSICAL EXAMINATION:  Vitals:   01/05/23 1358  BP: 129/67  Pulse: 90  Resp: 16  Temp: 98.1 F (36.7 C)  SpO2: 96%   Filed Weights   01/05/23 1358  Weight: 167 lb 9.6 oz (76 kg)    GENERAL: well appearing elderly Caucasian male in NAD  SKIN: skin color, texture, turgor are normal, no rashes or significant lesions EYES: conjunctiva are pink and non-injected, sclera clear LUNGS: clear to auscultation and  percussion with normal breathing effort HEART: regular rate & rhythm and no murmurs and no lower extremity edema Musculoskeletal: no cyanosis of digits and no clubbing  PSYCH: alert & oriented x 3, fluent speech NEURO: no focal motor/sensory deficits  LABORATORY DATA:  I have reviewed the data as listed    Latest Ref Rng & Units 01/05/2023    2:56 PM 08/21/2017    1:23 PM  CBC  WBC 4.0 - 10.5 K/uL 9.6  9.8   Hemoglobin 13.0 - 17.0 g/dL 16.1  09.6   Hematocrit 39.0 - 52.0 % 49.8  49.8   Platelets 150 - 400 K/uL 209  258        Latest Ref Rng & Units 01/05/2023    2:56 PM 08/23/2017    2:57 AM 08/22/2017    1:44 PM  CMP  Glucose 70 - 99 mg/dL 045  409  811   BUN 8 - 23 mg/dL 23  18  23    Creatinine 0.61 - 1.24 mg/dL 9.14  7.82  9.56   Sodium 135 - 145 mmol/L 139  136  133   Potassium 3.5 - 5.1 mmol/L 4.3  4.0  4.8   Chloride 98 - 111 mmol/L 105  106  102   CO2 22 - 32 mmol/L 28  22  20    Calcium 8.9 - 10.3 mg/dL 9.5  8.4  8.5   Total Protein 6.5 - 8.1 g/dL 7.1     Total Bilirubin 0.3 - 1.2 mg/dL 0.4     Alkaline Phos 38 - 126 U/L 64     AST 15 - 41 U/L 20     ALT 0 - 44 U/L 22        ASSESSMENT & PLAN Kainen Yax 72 y.o. male with medical history significant for GERD, type 2  diabetes, vitiligo, and hypertension who presents for evaluation of polycythemia.  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  There are two types of polycythemia, Primary polycythemia and secondary polycythemia. Primary polycythemia is overproduction of red blood cells due to a driver mutation. The most common mutation is the JAK2 V617F (95% of cases), but there are other mutations which can cause this disorder. Primary polycythemia is a myeloproliferative neoplasm which may require cytoreductive therapy to decrease risk of thrombosis. This can consist of  medications or phlebotomy to drive down the red blood cell counts. Secondary polycythemia is polycythemia driven by low oxygen levels. This represents an appropriate response of the body attempting to increase red cell volume. Causes of secondary polycythemia include smoking (most common), obstructive sleep apnea (OSA), or living at altitude. This can also be caused by testosterone supplementation. Certain thalassemias can present with marked erythrocytosis , but normal hemoglobin. Secondary polycythemia does not have the same level of thrombotic risk and therefore does not require cytoreductive therapy or phlebotomy.    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 Encourage smoking cessation.     --patient has tested positive for mild OSA in the past, report in our system from November 2022.  Encouraged him to reconnect with the pulmonologist/sleep medicine doctor for consideration of treatment as this may be the cause of his current polycythemia. --will order MPN workup to include JAK2 with reflex and BCR/ABL FISH  --RTC in 6 months or sooner if indicated by the above labs.    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 -- Low clinical suspicion this is related to another hematological issue.  Orders Placed This Encounter  Procedures   CBC with Differential (Cancer Center Only)    Standing Status:   Future    Number of Occurrences:   1    Standing Expiration Date:   01/05/2024   CMP (Cancer Center only)    Standing Status:   Future    Number of Occurrences:   1    Standing Expiration Date:   01/05/2024   BCR ABL1 FISH ([facility])    Standing Status:   Future    Number of Occurrences:   1    Standing Expiration Date:   01/05/2024   JAK2 (INCLUDING V617F AND EXON 12), MPL,& CALR W/RFL MPN PANEL (NGS)    Standing Status:   Future    Number of  Occurrences:   1    Standing Expiration Date:   01/05/2024   Erythropoietin    Standing Status:   Future    Number of Occurrences:   1    Standing Expiration Date:   01/05/2024    All questions were answered. The patient knows to call the clinic with any problems, questions or concerns.  A total of more than 60 minutes were spent on this encounter with face-to-face time and non-face-to-face time, including preparing to see the patient, ordering tests and/or medications, counseling the patient and coordination of care as outlined above.   Ulysees Barns, MD Department of Hematology/Oncology [facility] at [facility] Phone: [phone] Pager: [phone] Email: [email or web address]  01/05/2023 4:26 PM

## 2023-01-07 LAB — ERYTHROPOIETIN: Erythropoietin: 28.4 m[IU]/mL — ABNORMAL HIGH (ref 2.6–18.5)

## 2023-01-12 ENCOUNTER — Telehealth: Payer: Self-pay

## 2023-01-12 LAB — JAK2 (INCLUDING V617F AND EXON 12), MPL,& CALR W/RFL MPN PANEL (NGS)

## 2023-01-12 LAB — BCR ABL1 FISH (GENPATH)

## 2023-01-16 ENCOUNTER — Telehealth: Payer: Self-pay | Admitting: *Deleted

## 2023-01-16 NOTE — Telephone Encounter (Signed)
Received call back from pt regarding his recent lab results. Advised that that his labs did not reveal any underlying blood disorder. Per Dr. Leonides Schanz, pt's elevated HGB is likely related to pt's smoking and is advised to stop smoking. Pt states he has Chantix and plans to start taking that medication. Pt requested copies of all labs be sent to him in the mail. Advised that they would be mailed this afternoon. Pt is aware of his appt in February 2025

## 2023-07-04 ENCOUNTER — Other Ambulatory Visit: Payer: Self-pay | Admitting: Physician Assistant

## 2023-07-04 DIAGNOSIS — D751 Secondary polycythemia: Secondary | ICD-10-CM

## 2023-07-07 ENCOUNTER — Inpatient Hospital Stay: Payer: Medicare Other | Admitting: Physician Assistant

## 2023-07-07 ENCOUNTER — Other Ambulatory Visit: Payer: Medicare Other

## 2023-07-07 ENCOUNTER — Inpatient Hospital Stay: Payer: Medicare Other | Attending: Physician Assistant

## 2023-07-07 ENCOUNTER — Ambulatory Visit: Payer: Medicare Other | Admitting: Physician Assistant

## 2023-07-07 VITALS — BP 115/76 | HR 89 | Temp 97.5°F | Resp 18 | Wt 178.0 lb

## 2023-07-07 DIAGNOSIS — Z79899 Other long term (current) drug therapy: Secondary | ICD-10-CM | POA: Insufficient documentation

## 2023-07-07 DIAGNOSIS — D751 Secondary polycythemia: Secondary | ICD-10-CM | POA: Insufficient documentation

## 2023-07-07 DIAGNOSIS — F17210 Nicotine dependence, cigarettes, uncomplicated: Secondary | ICD-10-CM | POA: Insufficient documentation

## 2023-07-07 LAB — CMP (CANCER CENTER ONLY)
ALT: 30 U/L (ref 0–44)
AST: 22 U/L (ref 15–41)
Albumin: 4.6 g/dL (ref 3.5–5.0)
Alkaline Phosphatase: 51 U/L (ref 38–126)
Anion gap: 5 (ref 5–15)
BUN: 25 mg/dL — ABNORMAL HIGH (ref 8–23)
CO2: 27 mmol/L (ref 22–32)
Calcium: 9.8 mg/dL (ref 8.9–10.3)
Chloride: 106 mmol/L (ref 98–111)
Creatinine: 1.08 mg/dL (ref 0.61–1.24)
GFR, Estimated: >60 mL/min (ref >60)
Glucose, Bld: 147 mg/dL — ABNORMAL HIGH (ref 70–99)
Potassium: 4.7 mmol/L (ref 3.5–5.1)
Sodium: 138 mmol/L (ref 135–145)
Total Bilirubin: 0.5 mg/dL (ref 0.0–1.2)
Total Protein: 7.4 g/dL (ref 6.5–8.1)

## 2023-07-07 LAB — CBC WITH DIFFERENTIAL (CANCER CENTER ONLY)
Abs Immature Granulocytes: 0.04 10*3/uL (ref 0.00–0.07)
Basophils Absolute: 0 10*3/uL (ref 0.0–0.1)
Basophils Relative: 0 %
Eosinophils Absolute: 0 10*3/uL (ref 0.0–0.5)
Eosinophils Relative: 0 %
HCT: 48.3 % (ref 39.0–52.0)
Hemoglobin: 16.1 g/dL (ref 13.0–17.0)
Immature Granulocytes: 0 %
Lymphocytes Relative: 17 %
Lymphs Abs: 1.7 10*3/uL (ref 0.7–4.0)
MCH: 34.5 pg — ABNORMAL HIGH (ref 26.0–34.0)
MCHC: 33.3 g/dL (ref 30.0–36.0)
MCV: 103.6 fL — ABNORMAL HIGH (ref 80.0–100.0)
Monocytes Absolute: 0.9 10*3/uL (ref 0.1–1.0)
Monocytes Relative: 10 %
Neutro Abs: 7 10*3/uL (ref 1.7–7.7)
Neutrophils Relative %: 73 %
Platelet Count: 225 10*3/uL (ref 150–400)
RBC: 4.66 MIL/uL (ref 4.22–5.81)
RDW: 12.2 % (ref 11.5–15.5)
WBC Count: 9.7 10*3/uL (ref 4.0–10.5)
nRBC: 0 % (ref 0.0–0.2)

## 2023-07-07 NOTE — Progress Notes (Signed)
 [facility] Telephone:(336) [phone]   [phone]  PROGRESS NOTE  Patient Care Team: Darrow Bussing, MD as PCP - General (Family Medicine)  Hematological/Oncological History # Polycythemia 08/21/2017: WBC 9.8, Hgb 17.0, MCV 101, Plt 258 11/25/2022: White blood cell 11.4, hemoglobin 17.8, MCV 105, and platelets of 247 01/05/2023: establish care with Dr. Leonides Long  CHIEF COMPLAINT:  "Secondary Polycythemia "  HISTORY OF PRESENTING ILLNESS:  Luis Long 73 y.o. male returns for a follow up for secondary polycythemia.  Luis Long was last seen by Dr. Leonides Long on 8/29/20924 to establish care. In the interim, he has quit smoking approximately 4 months ago. He is otherwise feeling well without any new or concerning symptoms. His energy and appetite levels are stable. He denies nausea, vomiting or bowel habit changes. He denies easy bruising or signs of bleeding. He denies fevers, chills, sweats, shortness of breath, chest pain or cough. He has no other complaints.  Full 10 point ROS is otherwise negative.  MEDICAL HISTORY:  Past Medical History:  Diagnosis Date   ADHD    Chronic lower back pain    DKA (diabetic ketoacidoses)    Eye infection, left    "ongoing" (08/22/2017)   GERD (gastroesophageal reflux disease)    "q once in awhile" (08/22/2017)   Hepatitis C antibody test positive    "but I've never had Hep C" (08/22/2017)   Hyperglycemia    takes metformin   Hypertension    Type II diabetes mellitus (HCC)    Vitiligo    "my whole body" (08/22/2017)    SURGICAL HISTORY: Past Surgical History:  Procedure Laterality Date   BRAIN SURGERY  03/2009   trigeminal neuralgia   CHOLECYSTECTOMY     INGUINAL HERNIA REPAIR Bilateral    KNEE CARTILAGE SURGERY Left 1980    SOCIAL HISTORY: Social History   Socioeconomic History   Marital status: Single    Spouse name: Not on file   Number of children: Not on file   Years of education: Not on file   Highest  education level: Not on file  Occupational History   Not on file  Tobacco Use   Smoking status: Some Days    Current packs/day: 0.00    Average packs/day: 0.5 packs/day for 30.0 years (15.0 ttl pk-yrs)    Types: Cigars, Cigarettes    Start date: 03/10/1987    Last attempt to quit: 03/09/2017    Years since quitting: 6.3   Smokeless tobacco: Never   Tobacco comments:    Patient has an occasional cigar. HM 12/31/20.   Vaping Use   Vaping status: Never Used  Substance and Sexual Activity   Alcohol use: Yes    Alcohol/week: 3.0 standard drinks of alcohol    Types: 3 Cans of beer per week   Drug use: Not Currently   Sexual activity: Not Currently  Other Topics Concern   Not on file  Social History Narrative   Not on file   Social Drivers of Health   Financial Resource Strain: Medium Risk (03/24/2021)   Received from [facility], [facility]   Overall Financial Resource Strain (CARDIA)    Difficulty of Paying Living Expenses: Somewhat hard  Food Insecurity: No Food Insecurity (05/19/2021)   Received from [facility], [facility]   Hunger Vital Sign    Worried About Running Out of Food in the Last Year: Never true    Ran Out of Food in the Last Year: Never true  Transportation Needs: No Transportation  Needs (03/24/2021)   Received from [facility], [facility]   [facility] - Transportation    Lack of Transportation (Medical): No    Lack of Transportation (Non-Medical): No  Physical Activity: Insufficiently Active (03/24/2021)   Received from [facility], [facility]   Exercise Vital Sign    Days of Exercise per Week: 2 days    Minutes of Exercise per Session: 30 min  Stress: No Stress Concern Present (03/24/2021)   Received from [facility], [facility] - Occupational Stress Questionnaire    Feeling of Stress : Only a little  Social Connections: Unknown (09/21/2021)   Received from [facility], [facility]    Social Network    Social Network: Not on file  Intimate Partner Violence: Unknown (08/13/2021)   Received [facility], [facility]   HITS    Physically Hurt: Not on file    Insult or Talk Down To: Not on file    Threaten Physical Harm: Not on file    Scream or Curse: Not on file    FAMILY HISTORY: No family history on file.  ALLERGIES:  is allergic to erythromycin and vicodin [hydrocodone-acetaminophen].  MEDICATIONS:  Current Outpatient Medications  Medication Sig Dispense Refill   amphetamine-dextroamphetamine (ADDERALL) 10 MG tablet Take 10 mg by mouth daily.  0   aspirin EC 81 MG tablet Take 81 mg by mouth daily.     clobetasol (TEMOVATE) 0.05 % external solution Apply topically 2 (two) times daily as needed.     doxycycline (VIBRA-TABS) 100 MG tablet Take 100 mg by mouth 2 (two) times daily.     fluticasone (CUTIVATE) 0.05 % cream Apply topically 2 (two) times daily as needed.     JARDIANCE 10 MG TABS tablet Take 10 mg by mouth daily.     lisinopril (PRINIVIL,ZESTRIL) 10 MG tablet Take 10 mg by mouth daily.  1   MAGNESIUM PO Take 1 tablet by mouth daily.     metFORMIN (GLUCOPHAGE) 500 MG tablet Take 2 tablets (1,000 mg total) by mouth 2 (two) times daily. 60 tablet 3   Multiple Vitamins-Minerals (MULTI FOR HIM 50+) TABS Take 1 tablet by mouth daily.     Omega-3 Fatty Acids (FISH OIL) 1000 MG CAPS Take 1,000 mg by mouth daily.     POTASSIUM CHLORIDE PO Take by mouth.     rosuvastatin (CRESTOR) 5 MG tablet Take 5 mg by mouth daily.     triamcinolone cream (KENALOG) 0.1 % Apply topically daily.     TURMERIC PO Take by mouth.     blood glucose meter kit and supplies Dispense based on patient and insurance preference. Use up to four times daily as directed. (FOR ICD-10 E10.9, E11.9). (Patient not taking: Reported on 07/07/2023) 1 each 6   varenicline (CHANTIX) 1 MG tablet Take by mouth. (Patient not taking: Reported on 07/07/2023)     No current facility-administered  medications for this visit.    REVIEW OF SYSTEMS:   Constitutional: ( - ) fevers, ( - )  chills , ( - ) night sweats Eyes: ( - ) blurriness of vision, ( - ) double vision, ( - ) watery eyes Ears, nose, mouth, throat, and face: ( - ) mucositis, ( - ) sore throat Respiratory: ( - ) cough, ( - ) dyspnea, ( - ) wheezes Cardiovascular: ( - ) palpitation, ( - ) chest discomfort, ( - ) lower extremity swelling Gastrointestinal:  ( - ) nausea, ( - )  heartburn, ( - ) change in bowel habits Skin: ( - ) abnormal skin rashes Lymphatics: ( - ) new lymphadenopathy, ( - ) easy bruising Neurological: ( - ) numbness, ( - ) tingling, ( - ) new weaknesses Behavioral/Psych: ( - ) mood change, ( - ) new changes  All other systems were reviewed with the patient and are negative.  PHYSICAL EXAMINATION:  Vitals:   07/07/23 1330  BP: 115/76  Pulse: 89  Resp: 18  Temp: (!) 97.5 F (36.4 C)  SpO2: 99%   Filed Weights   07/07/23 1330  Weight: 178 lb (80.7 kg)    GENERAL: well appearing elderly Caucasian male in NAD  SKIN: skin color, texture, turgor are normal, no rashes or significant lesions EYES: conjunctiva are pink and non-injected, sclera clear LUNGS: clear to auscultation and percussion with normal breathing effort HEART: regular rate & rhythm and no murmurs and no lower extremity edema Musculoskeletal: no cyanosis of digits and no clubbing  PSYCH: alert & oriented x 3, fluent speech NEURO: no focal motor/sensory deficits  LABORATORY DATA:  I have reviewed the data as listed    Latest Ref Rng & Units 07/07/2023    1:10 PM 01/05/2023    2:56 PM 08/21/2017    1:23 PM  CBC  WBC 4.0 - 10.5 K/uL 9.7  9.6  9.8   Hemoglobin 13.0 - 17.0 g/dL 16.1  09.6  04.5   Hematocrit 39.0 - 52.0 % 48.3  49.8  49.8   Platelets 150 - 400 K/uL 225  209  258        Latest Ref Rng & Units 07/07/2023    1:10 PM 01/05/2023    2:56 PM 08/23/2017    2:57 AM  CMP  Glucose 70 - 99 mg/dL 409  811  914   BUN 8 - 23  mg/dL 25  23  18    Creatinine 0.61 - 1.24 mg/dL 7.82  9.56  2.13   Sodium 135 - 145 mmol/L 138  139  136   Potassium 3.5 - 5.1 mmol/L 4.7  4.3  4.0   Chloride 98 - 111 mmol/L 106  105  106   CO2 22 - 32 mmol/L 27  28  22    Calcium 8.9 - 10.3 mg/dL 9.8  9.5  8.4   Total Protein 6.5 - 8.1 g/dL 7.4  7.1    Total Bilirubin 0.0 - 1.2 mg/dL 0.5  0.4    Alkaline Phos 38 - 126 U/L 51  64    AST 15 - 41 U/L 22  20    ALT 0 - 44 U/L 30  22       ASSESSMENT & PLAN Elton Heid is a 72 y.o. male with medical history significant for GERD, type 2 diabetes, vitiligo, and hypertension who presents for a follow up for polycythemia.    # Polycythemia, likely 2/2 to Smoking/OSA --patient quit smoking 4 months ago.  --patient has tested positive for mild OSA in the past, report in our system from November 2022.  Encouraged him to reconnect with the pulmonologist/sleep medicine doctor for consideration of treatment as this may be the cause of his current polycythemia. --MPN workup with JAK2 with reflex and BCR/ABL FISH was negative.  --Labs show Hgb is back to normal with Hgb 16.1, WBC 9.7, Plt 225.  --No further workup required. Patient can return to our clinic as needed.    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 -- Low clinical suspicion this is related to another hematological issue. -- Recommend B complex supplement.   No orders of the defined types were placed in this encounter.   All questions were answered. The patient knows to call the clinic with any problems, questions or concerns.   I have spent a total of 25 minutes minutes of face-to-face and non-face-to-face time, preparing to see the patient, performing a medically appropriate examination, counseling and educating the patient, documenting clinical information in the electronic health record, independently interpreting results and communicating results to the patient, and care  coordination.   Georga Kaufmann PA-C Dept of Hematology and Oncology [facility] at [facility] Phone: [phone]  07/07/2023 2:58 PM

## 2023-08-03 IMAGING — CT CT CHEST HIGH RESOLUTION
2 of 7 series · 14 of 36 positions shown, 17 images · non-contrast
Comparison: 12/01/2020

CLINICAL DATA: Interstitial lung disease



[Series 4: high resolution · axial · 0.75mm/px · z∈[-174,+96]mm · 11 of 324 slices shown, 14 images]
[im 27/324  mediastinal]
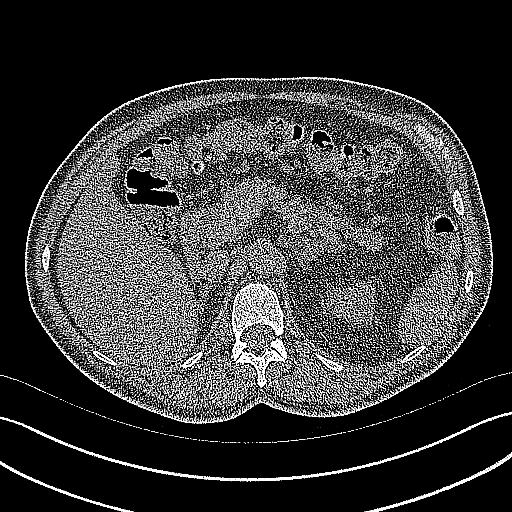
[im 27/324  lung]
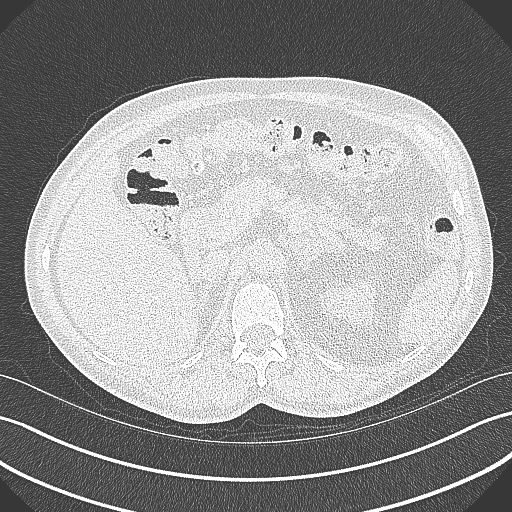
[im 54/324  lung]
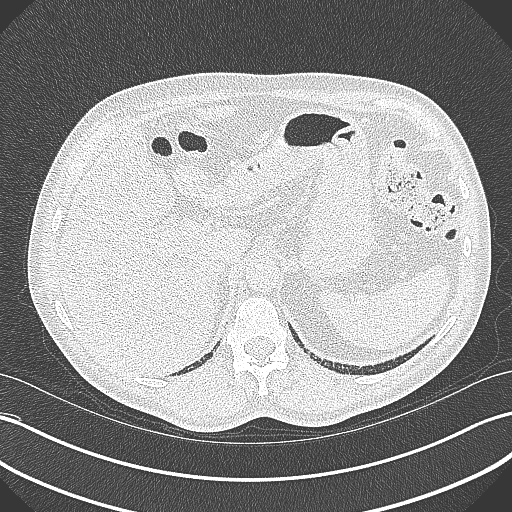
[im 81/324  lung]
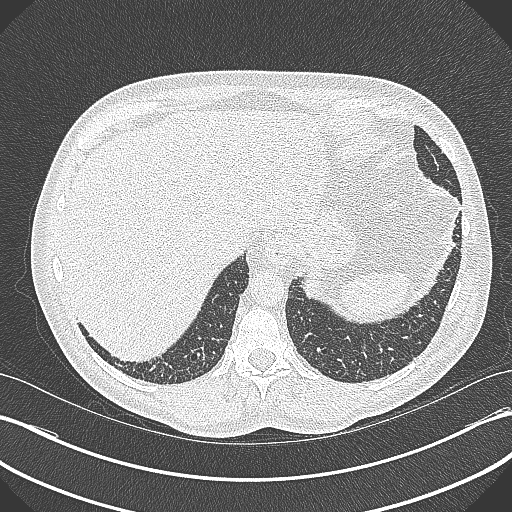
[im 108/324  lung]
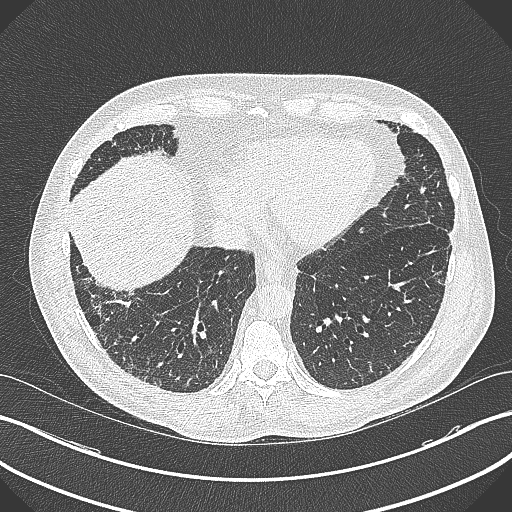
[im 135/324  mediastinal]
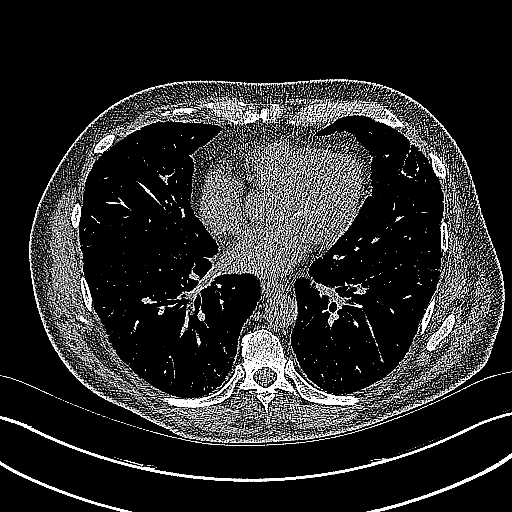
[im 135/324  lung]
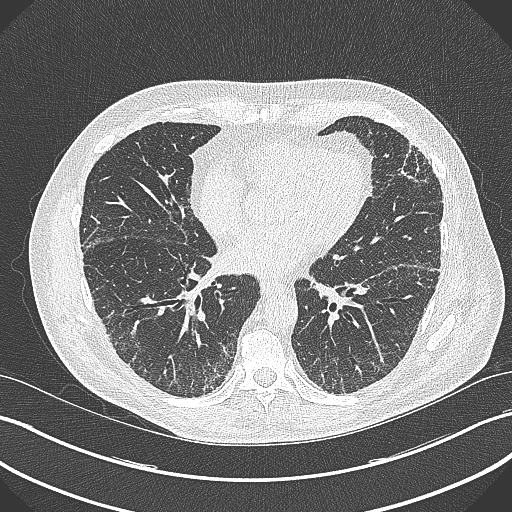
[im 162/324  lung]
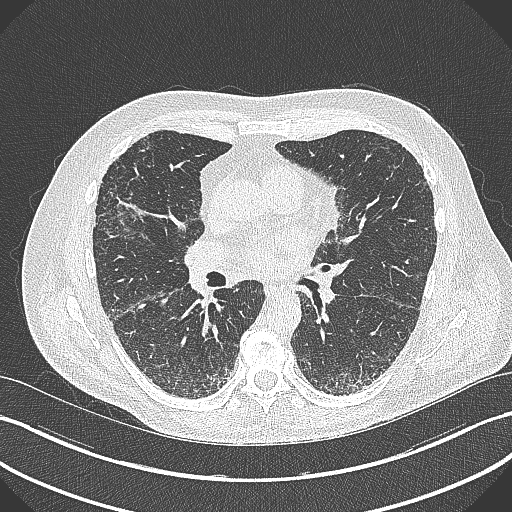
[im 189/324  lung]
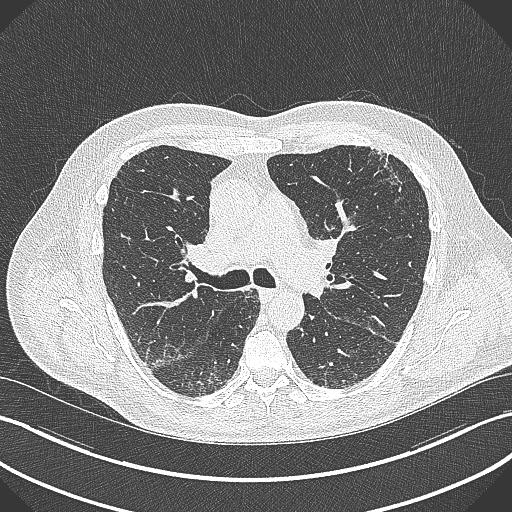
[im 216/324  lung]
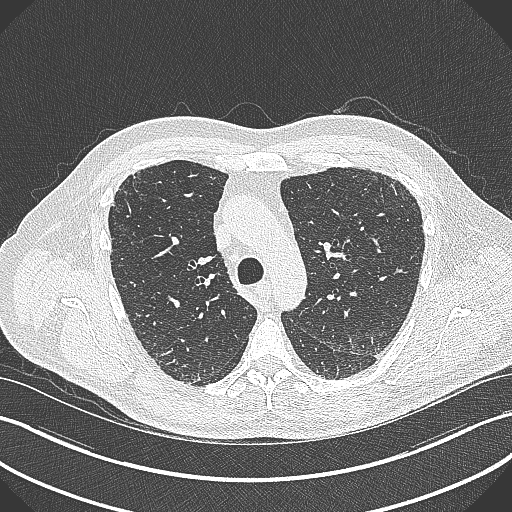
[im 243/324  mediastinal]
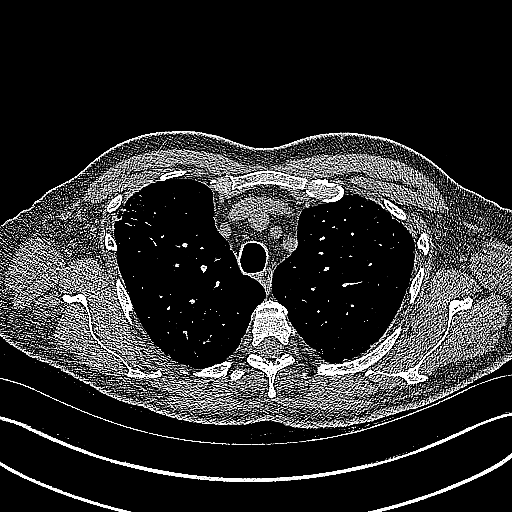
[im 243/324  lung]
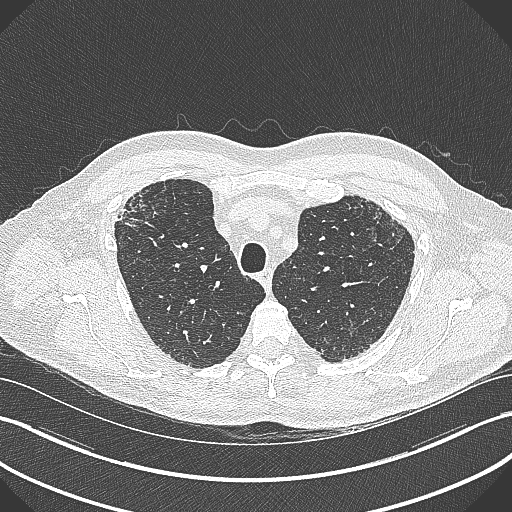
[im 270/324  lung]
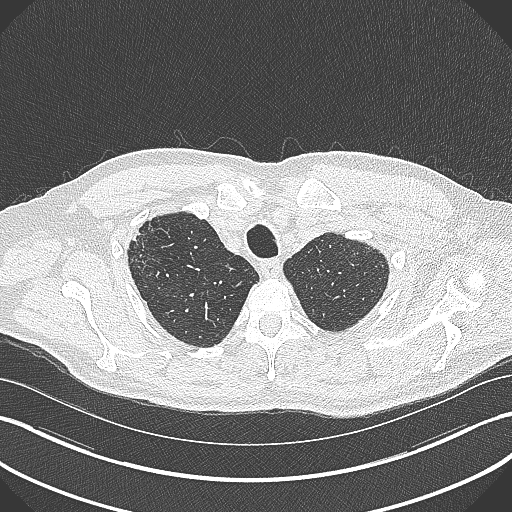
[im 297/324  lung]
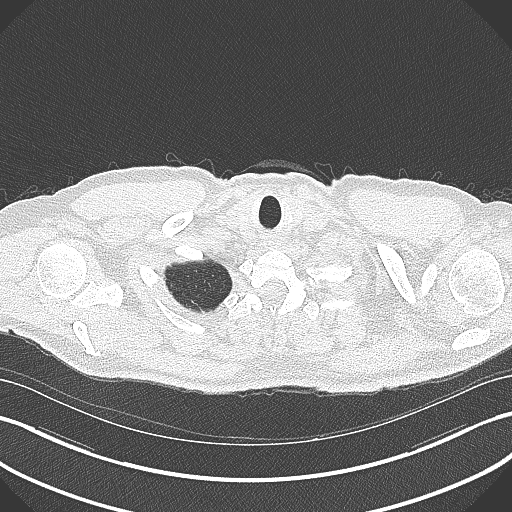

[Series 6: coronal · coronal · 0.62mm/px · 3 of 130 slices shown]
[im 26/130  lung]
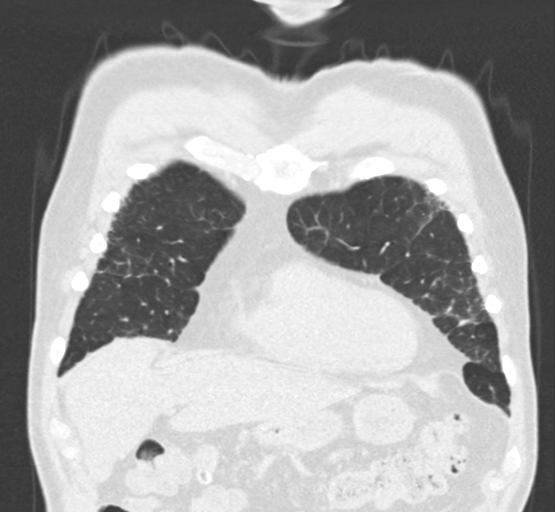
[im 52/130  lung]
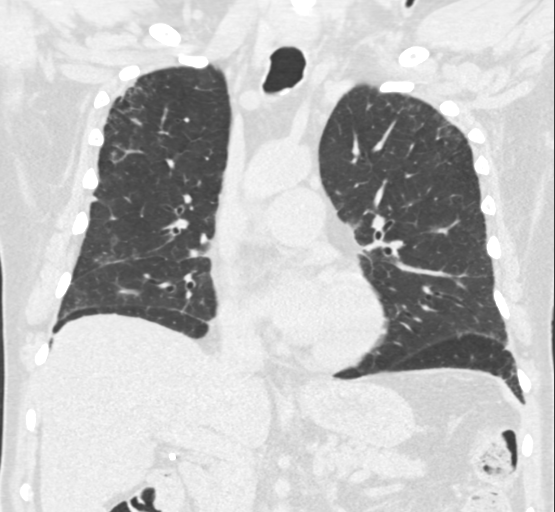
[im 78/130  lung]
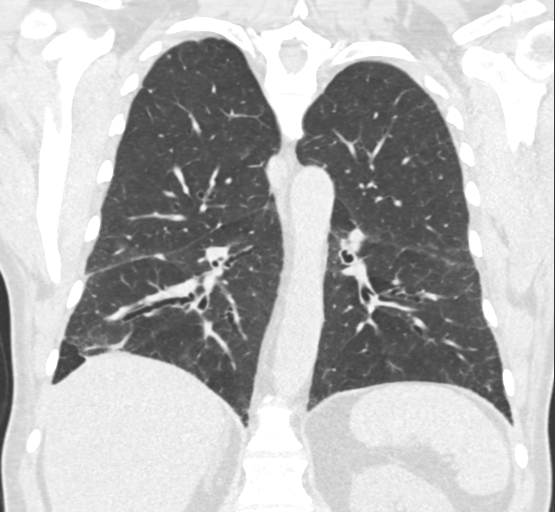

[14 of 36 positions shown; findings below may reference images not displayed]

FINDINGS: Cardiovascular: Aortic atherosclerosis. Normal heart size. No
pericardial effusion.

Mediastinum/Nodes: No enlarged mediastinal, hilar, or axillary lymph
nodes. Thyroid gland, trachea, and esophagus demonstrate no
significant findings.

Lungs/Pleura: Mild centrilobular and paraseptal emphysema. Unchanged
mild pulmonary fibrosis in a pattern with slight apical to basal
gradient featuring irregular peripheral interstitial opacity, septal
thickening, and traction bronchiectasis at the lung basis without
clear evidence of subpleural bronchiolectasis or honeycombing. No
pleural effusion or pneumothorax.

Upper Abdomen: No acute abnormality. Hepatic steatosis. Benign fatty
adenomatous thickening of the bilateral adrenal glands.

Musculoskeletal: No chest wall abnormality. No suspicious osseous
lesions identified.
IMPRESSION: 1. Unchanged mild pulmonary fibrosis in a pattern with slight apical
to basal gradient featuring irregular peripheral interstitial
opacity, septal thickening, and traction bronchiectasis at the lung
basis without clear evidence of subpleural bronchiolectasis or
honeycombing. Findings remain categorized as probable UIP per
consensus guidelines: Diagnosis of Idiopathic Pulmonary Fibrosis: An
Official ATS/ERS/JRS/ALAT Clinical Practice Guideline. Am J Respir
Crit Care Med Vol 198, Corbett 5, ppe44-[id number][DATE].
2. Emphysema.
3. Coronary artery disease.
4. Hepatic steatosis.

Aortic Atherosclerosis ([id number]-1M3.3) and Emphysema ([id number].4).

## 2024-01-15 ENCOUNTER — Ambulatory Visit
Admission: RE | Admit: 2024-01-15 | Discharge: 2024-01-15 | Disposition: A | Discharge status: Home / Self Care | Source: Ambulatory Visit

## 2024-01-15 ENCOUNTER — Other Ambulatory Visit: Payer: Self-pay

## 2024-01-15 DIAGNOSIS — R140 Abdominal distension (gaseous): Secondary | ICD-10-CM

## 2024-04-03 NOTE — Progress Notes (Signed)
 Luis Long                                          MRN: [id number]   04/03/2024   The [facility] Quality Team Specialist reviewed this patient medical record for the purposes of chart review for care gap closure. The following were reviewed: chart review for care gap closure-kidney health evaluation for diabetes:eGFR  and uACR.    [facility] Quality Team
